# Patient Record
Sex: Female | Born: 1965 | Race: White | Hispanic: No | Smoking: Never smoker
Health system: Southern US, Community
[De-identification: ages and names within clinical notes are randomized; demographics above are authoritative.]

## PROBLEM LIST (undated history)

## (undated) DIAGNOSIS — C44309 Unspecified malignant neoplasm of skin of other parts of face: Secondary | ICD-10-CM

## (undated) HISTORY — DX: Unspecified malignant neoplasm of skin of other parts of face: C44.309

---

## 2013-01-18 ENCOUNTER — Ambulatory Visit (INDEPENDENT_AMBULATORY_CARE_PROVIDER_SITE_OTHER): Payer: BC Managed Care – PPO | Admitting: Family Medicine

## 2013-01-18 VITALS — BP 102/52 | HR 52 | Temp 97.9°F | Resp 16

## 2013-01-18 DIAGNOSIS — J029 Acute pharyngitis, unspecified: Secondary | ICD-10-CM

## 2013-01-18 NOTE — Patient Instructions (Addendum)
Try over the counter Claritin or zyrtec once per day for allergy as possible cause of post nasal drip causing sore throat. Can also try Zantac for possible laryngopharyngeal reflux. Recheck if not improving in next few weeks, sooner if any worsening. You should receive a call or letter about your lab results within the next week to 10 days.   Sore Throat A sore throat is pain, burning, irritation, or scratchiness of the throat. There is often pain or tenderness when swallowing or talking. A sore throat may be accompanied by other symptoms, such as coughing, sneezing, fever, and swollen neck glands. A sore throat is often the first sign of another sickness, such as a cold, flu, strep throat, or mononucleosis (commonly known as mono). Most sore throats go away without medical treatment. CAUSES  The most common causes of a sore throat include:  A viral infection, such as a cold, flu, or mono.  A bacterial infection, such as strep throat, tonsillitis, or whooping cough.  Seasonal allergies.  Dryness in the air.  Irritants, such as smoke or pollution.  Gastroesophageal reflux disease (GERD). HOME CARE INSTRUCTIONS   Only take over-the-counter medicines as directed by your caregiver.  Drink enough fluids to keep your urine clear or pale yellow.  Rest as needed.  Try using throat sprays, lozenges, or sucking on hard candy to ease any pain (if older than 4 years or as directed).  Sip warm liquids, such as broth, herbal tea, or warm water with honey to relieve pain temporarily. You may also eat or drink cold or frozen liquids such as frozen ice pops.  Gargle with salt water (mix 1 tsp salt with 8 oz of water).  Do not smoke and avoid secondhand smoke.  Put a cool-mist humidifier in your bedroom at night to moisten the air. You can also turn on a hot shower and sit in the bathroom with the door closed for 5 10 minutes. SEEK IMMEDIATE MEDICAL CARE IF:  You have difficulty breathing.  You  are unable to swallow fluids, soft foods, or your saliva.  You have increased swelling in the throat.  Your sore throat does not get better in 7 days.  You have nausea and vomiting.  You have a fever or persistent symptoms for more than 2 3 days.  You have a fever and your symptoms suddenly get worse. MAKE SURE YOU:   Understand these instructions.  Will watch your condition.  Will get help right away if you are not doing well or get worse. Document Released: 06/25/2004 Document Revised: 05/04/2012 Document Reviewed: 01/24/2012 [facility] Patient Information 2014 [place], [place].

## 2013-01-18 NOTE — Progress Notes (Signed)
Subjective:    Patient ID: Haley Wright, female    DOB: 05-14-1966, 47 y.o.   MRN: [id number]  HPI Haley Wright is a 47 y.o. female Sore throat - r side of throat - past 2 weeks.  No fever, no heartburn.  Hurts to swallow. No cough/congestion, no runny nose. Able to eat and drink ok.   Tx: none.    Review of Systems  Constitutional: Negative for fever, chills and unexpected weight change.  HENT: Positive for sore throat. Negative for congestion, rhinorrhea, drooling, mouth sores, trouble swallowing, voice change and postnasal drip.   Respiratory: Negative for cough and shortness of breath.   Skin: Negative for rash.  Hematological: Negative for adenopathy.       Objective:   Physical Exam  Vitals reviewed. Constitutional: She is oriented to person, place, and time. She appears well-developed and well-nourished. No distress.  HENT:  Head: Normocephalic and atraumatic.  Right Ear: Hearing, tympanic membrane, external ear and ear canal normal.  Left Ear: Hearing, tympanic membrane, external ear and ear canal normal.  Nose: Nose normal.  Mouth/Throat: Oropharynx is clear and moist. No oropharyngeal exudate.  Eyes: Conjunctivae and EOM are normal. Pupils are equal, round, and reactive to light.  Cardiovascular: Normal rate, regular rhythm, normal heart sounds and intact distal pulses.   No murmur heard. Pulmonary/Chest: Effort normal and breath sounds normal. No respiratory distress. She has no wheezes. She has no rhonchi.  Lymphadenopathy:    She has no cervical adenopathy.  Neurological: She is alert and oriented to person, place, and time.  Skin: Skin is warm and dry. No rash noted.  Psychiatric: She has a normal mood and affect. Her behavior is normal.    Results for orders placed in visit on 01/18/13  POCT RAPID STREP A (OFFICE)      Result Value Range   Rapid Strep A Screen Negative  Negative         Assessment & Plan:  Haley Wright is a 47 y.o. female Sore  throat - Plan: POCT rapid strep A, Culture, Group A Strep  Doubt infection given 2 weeks hx, reassuring exam, but could be viral in nature. Throat cx pending.  Possible PND, vs LPR. Trial of otc sx care as below, but rtc precautions if not resolving.   Patient Instructions  Try over the counter Claritin or zyrtec once per day for allergy as possible cause of post nasal drip causing sore throat. Can also try Zantac for possible laryngopharyngeal reflux. Recheck if not improving in next few weeks, sooner if any worsening. You should receive a call or letter about your lab results within the next week to 10 days.   Sore Throat A sore throat is pain, burning, irritation, or scratchiness of the throat. There is often pain or tenderness when swallowing or talking. A sore throat may be accompanied by other symptoms, such as coughing, sneezing, fever, and swollen neck glands. A sore throat is often the first sign of another sickness, such as a cold, flu, strep throat, or mononucleosis (commonly known as mono). Most sore throats go away without medical treatment. CAUSES  The most common causes of a sore throat include:  A viral infection, such as a cold, flu, or mono.  A bacterial infection, such as strep throat, tonsillitis, or whooping cough.  Seasonal allergies.  Dryness in the air.  Irritants, such as smoke or pollution.  Gastroesophageal reflux disease (GERD). HOME CARE INSTRUCTIONS   Only take over-the-counter medicines as directed  by your caregiver.  Drink enough fluids to keep your urine clear or pale yellow.  Rest as needed.  Try using throat sprays, lozenges, or sucking on hard candy to ease any pain (if older than 4 years or as directed).  Sip warm liquids, such as broth, herbal tea, or warm water with honey to relieve pain temporarily. You may also eat or drink cold or frozen liquids such as frozen ice pops.  Gargle with salt water (mix 1 tsp salt with 8 oz of water).  Do not  smoke and avoid secondhand smoke.  Put a cool-mist humidifier in your bedroom at night to moisten the air. You can also turn on a hot shower and sit in the bathroom with the door closed for 5 10 minutes. SEEK IMMEDIATE MEDICAL CARE IF:  You have difficulty breathing.  You are unable to swallow fluids, soft foods, or your saliva.  You have increased swelling in the throat.  Your sore throat does not get better in 7 days.  You have nausea and vomiting.  You have a fever or persistent symptoms for more than 2 3 days.  You have a fever and your symptoms suddenly get worse. MAKE SURE YOU:   Understand these instructions.  Will watch your condition.  Will get help right away if you are not doing well or get worse. Document Released: 06/25/2004 Document Revised: 05/04/2012 Document Reviewed: 01/24/2012 [facility] Patient Information 2014 [place], [place].

## 2013-01-20 LAB — CULTURE, GROUP A STREP: Organism ID, Bacteria: NORMAL

## 2013-03-06 ENCOUNTER — Ambulatory Visit (INDEPENDENT_AMBULATORY_CARE_PROVIDER_SITE_OTHER): Payer: BC Managed Care – PPO | Admitting: Neurology

## 2013-03-06 ENCOUNTER — Ambulatory Visit (INDEPENDENT_AMBULATORY_CARE_PROVIDER_SITE_OTHER): Payer: Self-pay

## 2013-03-06 DIAGNOSIS — Z0289 Encounter for other administrative examinations: Secondary | ICD-10-CM

## 2013-03-06 DIAGNOSIS — R209 Unspecified disturbances of skin sensation: Secondary | ICD-10-CM

## 2013-03-06 NOTE — Procedures (Signed)
HISTORY:  Haley Wright is a 47 year old patient with a 5 year history of paresthesias involving the lower extremities, going up two thirds the way below the knees. Within the last year, the patient has had similar sensations in the hands and forearms. The patient denies any neck or low back discomfort, problems with balance, or problems controlling the bowels or the bladder. The patient is being evaluated for a possible neuropathy or a radiculopathy.  NERVE CONDUCTION STUDIES:  Nerve conduction studies were performed on the right upper extremity. The distal motor latencies and motor amplitudes for the median and ulnar nerves were within normal limits. The F wave latencies and nerve conduction velocities for these nerves were also normal. The sensory latencies for the median and ulnar nerves were normal.  Nerve conduction studies were performed on right lower extremity. The distal motor latencies and motor amplitudes for the peroneal and posterior tibial nerves were within normal limits. The nerve conduction velocities for these nerves were also normal. The H reflex latency was normal. The sensory latency for the peroneal nerve was within normal limits.   EMG STUDIES:  EMG study was performed on the right upper extremity:  The first dorsal interosseous muscle reveals 2 to 4 K units with full recruitment. No fibrillations or positive waves were noted. The abductor pollicis brevis muscle reveals 2 to 4 K units with full recruitment. No fibrillations or positive waves were noted. The extensor indicis proprius muscle reveals 1 to 3 K units with full recruitment. No fibrillations or positive waves were noted. The pronator teres muscle reveals 2 to 3 K units with full recruitment. No fibrillations or positive waves were noted. The biceps muscle reveals 1 to 2 K units with full recruitment. No fibrillations or positive waves were noted. The triceps muscle reveals 2 to 4 K units with full recruitment.  No fibrillations or positive waves were noted. The anterior deltoid muscle reveals 2 to 3 K units with full recruitment. No fibrillations or positive waves were noted. The cervical paraspinal muscles were tested at 2 levels. No abnormalities of insertional activity were seen at either level tested. There was good relaxation.  EMG study was performed on the right lower extremity:  The tibialis anterior muscle reveals 2 to 4K motor units with full recruitment. No fibrillations or positive waves were seen. The peroneus tertius muscle reveals 2 to 4K motor units with full recruitment. No fibrillations or positive waves were seen. The medial gastrocnemius muscle reveals 1 to 3K motor units with full recruitment. No fibrillations or positive waves were seen. The vastus lateralis muscle reveals 2 to 4K motor units with full recruitment. No fibrillations or positive waves were seen. The iliopsoas muscle reveals 2 to 4K motor units with full recruitment. No fibrillations or positive waves were seen. The biceps femoris muscle (long head) reveals 2 to 4K motor units with full recruitment. No fibrillations or positive waves were seen. The lumbosacral paraspinal muscles were tested at 3 levels, and revealed no abnormalities of insertional activity at all 3 levels tested. There was good relaxation.   IMPRESSION:  Nerve conduction studies involving the right upper and right lower extremities were unremarkable. There is no evidence of a peripheral neuropathy. A small fiber neuropathy however, may be missed by a standard nerve conduction studies. Clinical correlation is required. EMG evaluation of the right upper and right lower extremities were unremarkable. No evidence of a right cervical or a right lumbosacral radiculopathy is seen.  Marlan Palau MD 03/06/2013 11:09  AM  [facility] [address] [place], [place] [address]  Phone [phone] Fax [phone]

## 2013-04-27 ENCOUNTER — Ambulatory Visit (INDEPENDENT_AMBULATORY_CARE_PROVIDER_SITE_OTHER): Payer: BC Managed Care – PPO | Admitting: Emergency Medicine

## 2013-04-27 VITALS — BP 90/48 | HR 57 | Temp 98.2°F | Resp 16 | Ht 69.75 in | Wt 150.2 lb

## 2013-04-27 DIAGNOSIS — J020 Streptococcal pharyngitis: Secondary | ICD-10-CM

## 2013-04-27 DIAGNOSIS — J029 Acute pharyngitis, unspecified: Secondary | ICD-10-CM

## 2013-04-27 LAB — POCT RAPID STREP A (OFFICE): Rapid Strep A Screen: POSITIVE — AB

## 2013-04-27 MED ORDER — PENICILLIN V POTASSIUM 500 MG PO TABS: 500.0000 mg | ORAL_TABLET | Freq: Four times a day (QID) | ORAL | Status: DC

## 2013-04-27 NOTE — Progress Notes (Signed)
[facility] [address], [place] [address] [phone]  Date:  04/27/2013   Name:  Haley Wright   DOB:  28-Jul-1965   MRN:  [id number]  PCP:  Gweneth Dimitri, MD    Chief Complaint: Adenopathy, Sore Throat and Otalgia   History of Present Illness:  Haley Wright is a 47 y.o. very pleasant female patient who presents with the following:  Sore throat, right lymphadenitis.  Tender with right intermittent otalgia.  No fever or chills, nausea or vomiting or stool change or rash.  No improvement with over the counter medications or other home remedies. Denies other complaint or health concern today.   There are no active problems to display for this patient.   History reviewed. No pertinent past medical history.  Past Surgical History  Procedure Laterality Date  . Cesarean section      History  Substance Use Topics  . Smoking status: Never Smoker   . Smokeless tobacco: Not on file  . Alcohol Use: No    History reviewed. No pertinent family history.  No Known Allergies  Medication list has been reviewed and updated.  No current outpatient prescriptions on file prior to visit.   No current facility-administered medications on file prior to visit.    Review of Systems:  As per HPI, otherwise negative.    Physical Examination: Filed Vitals:   04/27/13 0957  BP: 90/48  Pulse: 57  Temp: 98.2 F (36.8 C)  Resp: 16   Filed Vitals:   04/27/13 0957  Height: 5' 9.75" (1.772 m)  Weight: 150 lb 3.2 oz (68.13 kg)   Body mass index is 21.7 kg/(m^2). Ideal Body Weight: Weight in (lb) to have BMI = 25: 172.6  GEN: WDWN, NAD, Non-toxic, A & O x 3 HEENT: Atraumatic, Normocephalic. Neck supple. No masses, right anterior cervical LAD. Ears and Nose: No external deformity. CV: RRR, No M/G/R. No JVD. No thrill. No extra heart sounds. PULM: CTA B, no wheezes, crackles, rhonchi. No retractions. No resp. distress. No accessory muscle use. ABD: S,  NT, ND, +BS. No rebound. No HSM. EXTR: No c/c/e NEURO Normal gait.  PSYCH: Normally interactive. Conversant. Not depressed or anxious appearing.  Calm demeanor.    Assessment and Plan:  Strep Pen vk   Signed,  Phillips Odor, MD   Results for orders placed in visit on 04/27/13  POCT RAPID STREP A (OFFICE)      Result Value Range   Rapid Strep A Screen Positive (*) Negative

## 2013-04-27 NOTE — Patient Instructions (Signed)
Strep Throat  Strep throat is an infection of the throat caused by a bacteria named Streptococcus pyogenes. Your caregiver may call the infection streptococcal "tonsillitis" or "pharyngitis" depending on whether there are signs of inflammation in the tonsils or back of the throat. Strep throat is most common in children aged 48 48 years during the cold months of the year, but it can occur in people of any age during any season. This infection is spread from person to person (contagious) through coughing, sneezing, or other close contact.  SYMPTOMS   · Fever or chills.  · Painful, swollen, red tonsils or throat.  · Pain or difficulty when swallowing.  · White or yellow spots on the tonsils or throat.  · Swollen, tender lymph nodes or "glands" of the neck or under the jaw.  · Red rash all over the body (rare).  DIAGNOSIS   Many different infections can cause the same symptoms. A test must be done to confirm the diagnosis so the right treatment can be given. A "rapid strep test" can help your caregiver make the diagnosis in a few minutes. If this test is not available, a light swab of the infected area can be used for a throat culture test. If a throat culture test is done, results are usually available in a day or two.  TREATMENT   Strep throat is treated with antibiotic medicine.  HOME CARE INSTRUCTIONS   · Gargle with 1 tsp of salt in 1 cup of warm water, 3 4 times per day or as needed for comfort.  · Family members who also have a sore throat or fever should be tested for strep throat and treated with antibiotics if they have the strep infection.  · Make sure everyone in your household washes their hands well.  · Do not share food, drinking cups, or personal items that could cause the infection to spread to others.  · You may need to eat a soft food diet until your sore throat gets better.  · Drink enough water and fluids to keep your urine clear or pale yellow. This will help prevent dehydration.  · Get plenty of  rest.  · Stay home from school, daycare, or work until you have been on antibiotics for 24 hours.  · Only take over-the-counter or prescription medicines for pain, discomfort, or fever as directed by your caregiver.  · If antibiotics are prescribed, take them as directed. Finish them even if you start to feel better.  SEEK MEDICAL CARE IF:   · The glands in your neck continue to enlarge.  · You develop a rash, cough, or earache.  · You cough up green, yellow-brown, or bloody sputum.  · You have pain or discomfort not controlled by medicines.  · Your problems seem to be getting worse rather than better.  SEEK IMMEDIATE MEDICAL CARE IF:   · You develop any new symptoms such as vomiting, severe headache, stiff or painful neck, chest pain, shortness of breath, or trouble swallowing.  · You develop severe throat pain, drooling, or changes in your voice.  · You develop swelling of the neck, or the skin on the neck becomes red and tender.  · You have a fever.  · You develop signs of dehydration, such as fatigue, dry mouth, and decreased urination.  · You become increasingly sleepy, or you cannot wake up completely.  Document Released: 05/15/2000 Document Revised: 05/04/2012 Document Reviewed: 07/17/2010  [employer]® Patient Information ©2014 [employer].

## 2014-01-11 ENCOUNTER — Ambulatory Visit (INDEPENDENT_AMBULATORY_CARE_PROVIDER_SITE_OTHER): Payer: BC Managed Care – PPO | Admitting: Emergency Medicine

## 2014-01-11 VITALS — BP 114/62 | HR 49 | Temp 98.0°F | Resp 16 | Ht 70.0 in | Wt 154.6 lb

## 2014-01-11 DIAGNOSIS — Z0000 Encounter for general adult medical examination without abnormal findings: Secondary | ICD-10-CM

## 2014-01-11 NOTE — Progress Notes (Signed)
[facility] [address] [place] [address] [phone]  Date:  01/11/2014   Name:  Haley Wright   DOB:  1966-03-27   MRN:  [id number]  PCP:  Cari Caraway, MD    Chief Complaint: Paperwork   History of Present Illness:  Haley Wright is a 48 y.o. very pleasant female patient who presents with the following:  For record review   There are no active problems to display for this patient.   History reviewed. No pertinent past medical history.  Past Surgical History  Procedure Laterality Date  . Cesarean section      History  Substance Use Topics  . Smoking status: Never Smoker   . Smokeless tobacco: Not on file  . Alcohol Use: No    History reviewed. No pertinent family history.  No Known Allergies  Medication list has been reviewed and updated.  Current Outpatient Prescriptions on File Prior to Visit  Medication Sig Dispense Refill  . penicillin v potassium (VEETID) 500 MG tablet Take 1 tablet (500 mg total) by mouth 4 (four) times daily.  40 tablet  0   No current facility-administered medications on file prior to visit.    Review of Systems:  As per HPI, otherwise negative.    Physical Examination: Filed Vitals:   01/11/14 1048  BP: 114/62  Pulse: 49  Temp: 98 F (36.7 C)  Resp: 16   Filed Vitals:   01/11/14 1048  Height: 5\' 10"  (1.778 m)  Weight: 154 lb 9.6 oz (70.126 kg)   Body mass index is 22.18 kg/(m^2). Ideal Body Weight: Weight in (lb) to have BMI = 25: 173.9   GEN: WDWN, NAD, Non-toxic, Alert & Oriented x 3 HEENT: Atraumatic, Normocephalic.  Ears and Nose: No external deformity. EXTR: No clubbing/cyanosis/edema NEURO: Normal gait.  PSYCH: Normally interactive. Conversant. Not depressed or anxious appearing.  Calm demeanor.    Assessment and Plan: Medical record review   Signed,  Ellison Carwin, MD

## 2014-07-21 ENCOUNTER — Telehealth: Payer: Self-pay | Admitting: Physician Assistant

## 2014-07-21 NOTE — Telephone Encounter (Signed)
Entered in error

## 2015-06-12 ENCOUNTER — Ambulatory Visit (INDEPENDENT_AMBULATORY_CARE_PROVIDER_SITE_OTHER): Payer: BLUE CROSS/BLUE SHIELD | Admitting: Family Medicine

## 2015-06-12 VITALS — BP 110/76 | HR 86 | Temp 97.6°F | Resp 20 | Ht 69.0 in | Wt 156.6 lb

## 2015-06-12 DIAGNOSIS — J0100 Acute maxillary sinusitis, unspecified: Secondary | ICD-10-CM | POA: Diagnosis not present

## 2015-06-12 MED ORDER — AMOXICILLIN-POT CLAVULANATE 875-125 MG PO TABS: 1.0000 | ORAL_TABLET | Freq: Two times a day (BID) | ORAL | Status: DC

## 2015-06-12 NOTE — Progress Notes (Signed)
  Subjective:     Haley Wright is a 50 y.o. female who presents for evaluation of sinus pain. Symptoms include: clear rhinorrhea, congestion, nasal congestion, purulent rhinorrhea and sinus pressure. Onset of symptoms was 8 days ago. Symptoms have been gradually worsening since that time. Past history is significant for no history of pneumonia or bronchitis. Patient is a non-smoker.  The following portions of the patient's history were reviewed and updated as appropriate: allergies, current medications, past family history, past medical history, past social history, past surgical history and problem list.  Review of Systems Pertinent items are noted in HPI.   Objective:    BP 110/76 mmHg  Pulse 86  Temp(Src) 97.6 F (36.4 C) (Oral)  Resp 20  Ht 5\' 9"  (1.753 m)  Wt 156 lb 9.6 oz (71.033 kg)  BMI 23.12 kg/m2  SpO2 98%  LMP 05/11/2015 General appearance: alert, cooperative and appears stated age Nose: right turbinate pale, swollen, left turbinate pale, swollen, sinus tenderness bilateral Throat: lips, mucosa, and tongue normal; teeth and gums normal Chest: RRR, CTAB      Assessment:    Acute bacterial sinusitis.    Plan:    Nasal saline sprays. Neti pot recommended. Instructions given. Nasal steroids per medication orders. Augmentin per medication orders.

## 2015-06-15 ENCOUNTER — Ambulatory Visit (INDEPENDENT_AMBULATORY_CARE_PROVIDER_SITE_OTHER): Payer: BLUE CROSS/BLUE SHIELD | Admitting: Physician Assistant

## 2015-06-15 VITALS — BP 96/54 | HR 60 | Temp 97.6°F | Resp 18 | Ht 69.0 in | Wt 151.8 lb

## 2015-06-15 DIAGNOSIS — J0100 Acute maxillary sinusitis, unspecified: Secondary | ICD-10-CM

## 2015-06-15 MED ORDER — MOXIFLOXACIN HCL 400 MG PO TABS: 400.0000 mg | ORAL_TABLET | Freq: Every day | ORAL | Status: DC

## 2015-06-15 MED ORDER — PREDNISONE 20 MG PO TABS: ORAL_TABLET | ORAL | Status: DC

## 2015-06-15 MED ORDER — GUAIFENESIN ER 1200 MG PO TB12: 1.0000 | ORAL_TABLET | Freq: Two times a day (BID) | ORAL | Status: DC | PRN

## 2015-06-15 NOTE — Patient Instructions (Signed)
Get plenty of rest and drink at least 64 ounces of water daily. 

## 2015-06-15 NOTE — Progress Notes (Signed)
Patient ID: Haley Wright, female    DOB: [birth year], 50 y.o.   MRN: [id number]  PCP: Cari Caraway, MD  Subjective:   Chief Complaint  Patient presents with  . Medication Problem    Augmentin not working, pt wants to change prescription    HPI Presents for evaluation of sinusitis.  Was seen here on 06/12/2015 with these symptoms and prescribed Augmentin. Not any better. Now with eye irritation as well. Multiple sick contacts as an EMT. Subjective chills. None documented. Normal temperature when she was here 2 days ago. Netti pot use still produces gray-green drainage. Not coughing much, just from the drip in the throat.    Review of Systems As above. No GI/GU symptoms.    Patient Active Problem List   Diagnosis Date Noted  . 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 Authorizing Provider  amoxicillin-clavulanate (AUGMENTIN) 875-125 MG tablet Take 1 tablet by mouth 2 (two) times daily. 06/12/15  Yes Nolon Rod, DO  fluorouracil (EFUDEX) 5 % cream  04/04/15   Historical Provider, MD     No Known Allergies     Objective:  Physical Exam  Constitutional: She is oriented to person, place, and time. She appears well-developed and well-nourished. No distress.  BP 96/54 mmHg  Pulse 60  Temp(Src) 97.6 F (36.4 C) (Oral)  Resp 18  Ht 5\' 9"  (1.753 m)  Wt 151 lb 12.8 oz (68.856 kg)  BMI 22.41 kg/m2  SpO2 98%  LMP 05/11/2015   HENT:  Head: Normocephalic and atraumatic.  Right Ear: Hearing, tympanic membrane, external ear and ear canal normal.  Left Ear: Hearing, tympanic membrane, external ear and ear canal normal.  Nose: Mucosal edema and rhinorrhea present.  No foreign bodies. Right sinus exhibits maxillary sinus tenderness. Right sinus exhibits no frontal sinus tenderness. Left sinus exhibits maxillary sinus tenderness. Left sinus exhibits no frontal sinus tenderness.  Mouth/Throat: Uvula  is midline, oropharynx is clear and moist and mucous membranes are normal. No uvula swelling. No oropharyngeal exudate.  Eyes: Conjunctivae and EOM are normal. Pupils are equal, round, and reactive to light. Right eye exhibits no discharge. Left eye exhibits no discharge. No scleral icterus.  Neck: Trachea normal, normal range of motion, full passive range of motion without pain and phonation normal. Neck supple. No thyroid mass and no thyromegaly present.  Cardiovascular: Normal rate, regular rhythm and normal heart sounds.   Pulmonary/Chest: Effort normal and breath sounds normal.  Lymphadenopathy:       Head (right side): No submandibular, no tonsillar, no preauricular, no posterior auricular and no occipital adenopathy present.       Head (left side): No submandibular, no tonsillar, no preauricular and no occipital adenopathy present.    She has no cervical adenopathy.       Right: No supraclavicular adenopathy present.       Left: No supraclavicular adenopathy present.  Neurological: She is alert and oriented to person, place, and time. She has normal strength. No cranial nerve deficit or sensory deficit.  Skin: Skin is warm, dry and intact. No rash noted.  Psychiatric: She has a normal mood and affect. Her speech is normal and behavior is normal.           Assessment & Plan:   1. Acute maxillary sinusitis, recurrence not specified [J01.00] No better, and with new symptoms, on antibiotics x 48 hours. Switch to Avelox. Add prednisone. Supportive care. -  moxifloxacin (AVELOX) 400 MG tablet; Take 1 tablet (400 mg total) by mouth daily.  Dispense: 10 tablet; Refill: 0 - predniSONE (DELTASONE) 20 MG tablet; Take 3 PO QAM x3days, 2 PO QAM x3days, 1 PO QAM x3days  Dispense: 18 tablet; Refill: 0 - Guaifenesin (MUCINEX MAXIMUM STRENGTH) 1200 MG TB12; Take 1 tablet (1,200 mg total) by mouth every 12 (twelve) hours as needed.  Dispense: 14 tablet; Refill: 1   Fara Chute,  PA-C Physician Assistant-Certified [facility]

## 2015-06-17 NOTE — Progress Notes (Signed)
  Medical screening examination/treatment/procedure(s) were performed by non-physician practitioner and as supervising physician I was immediately available for consultation/collaboration.     

## 2015-09-04 DIAGNOSIS — M79645 Pain in left finger(s): Secondary | ICD-10-CM | POA: Diagnosis not present

## 2015-09-18 DIAGNOSIS — M79645 Pain in left finger(s): Secondary | ICD-10-CM | POA: Diagnosis not present

## 2016-01-14 DIAGNOSIS — N8111 Cystocele, midline: Secondary | ICD-10-CM | POA: Diagnosis not present

## 2016-01-22 DIAGNOSIS — M25551 Pain in right hip: Secondary | ICD-10-CM | POA: Diagnosis not present

## 2016-02-28 DIAGNOSIS — Z85828 Personal history of other malignant neoplasm of skin: Secondary | ICD-10-CM | POA: Diagnosis not present

## 2016-02-28 DIAGNOSIS — L814 Other melanin hyperpigmentation: Secondary | ICD-10-CM | POA: Diagnosis not present

## 2016-02-28 DIAGNOSIS — D2262 Melanocytic nevi of left upper limb, including shoulder: Secondary | ICD-10-CM | POA: Diagnosis not present

## 2016-02-28 DIAGNOSIS — D225 Melanocytic nevi of trunk: Secondary | ICD-10-CM | POA: Diagnosis not present

## 2016-03-04 DIAGNOSIS — L290 Pruritus ani: Secondary | ICD-10-CM | POA: Diagnosis not present

## 2016-03-04 DIAGNOSIS — K642 Third degree hemorrhoids: Secondary | ICD-10-CM | POA: Diagnosis not present

## 2016-03-04 DIAGNOSIS — K625 Hemorrhage of anus and rectum: Secondary | ICD-10-CM | POA: Diagnosis not present

## 2016-03-25 DIAGNOSIS — K641 Second degree hemorrhoids: Secondary | ICD-10-CM | POA: Diagnosis not present

## 2016-04-08 DIAGNOSIS — K641 Second degree hemorrhoids: Secondary | ICD-10-CM | POA: Diagnosis not present

## 2016-05-11 DIAGNOSIS — Z1211 Encounter for screening for malignant neoplasm of colon: Secondary | ICD-10-CM | POA: Diagnosis not present

## 2016-07-09 DIAGNOSIS — L570 Actinic keratosis: Secondary | ICD-10-CM | POA: Diagnosis not present

## 2016-07-09 DIAGNOSIS — L821 Other seborrheic keratosis: Secondary | ICD-10-CM | POA: Diagnosis not present

## 2016-07-27 DIAGNOSIS — Z0000 Encounter for general adult medical examination without abnormal findings: Secondary | ICD-10-CM | POA: Diagnosis not present

## 2016-07-27 DIAGNOSIS — N926 Irregular menstruation, unspecified: Secondary | ICD-10-CM | POA: Diagnosis not present

## 2016-09-22 DIAGNOSIS — Z01419 Encounter for gynecological examination (general) (routine) without abnormal findings: Secondary | ICD-10-CM | POA: Diagnosis not present

## 2016-09-22 DIAGNOSIS — Z6821 Body mass index (BMI) 21.0-21.9, adult: Secondary | ICD-10-CM | POA: Diagnosis not present

## 2017-01-01 DIAGNOSIS — M25551 Pain in right hip: Secondary | ICD-10-CM | POA: Diagnosis not present

## 2017-01-07 DIAGNOSIS — M25551 Pain in right hip: Secondary | ICD-10-CM | POA: Diagnosis not present

## 2017-01-18 DIAGNOSIS — H1089 Other conjunctivitis: Secondary | ICD-10-CM | POA: Diagnosis not present

## 2017-01-26 DIAGNOSIS — B309 Viral conjunctivitis, unspecified: Secondary | ICD-10-CM | POA: Diagnosis not present

## 2017-02-05 DIAGNOSIS — M25551 Pain in right hip: Secondary | ICD-10-CM | POA: Diagnosis not present

## 2017-02-05 DIAGNOSIS — S73191D Other sprain of right hip, subsequent encounter: Secondary | ICD-10-CM | POA: Diagnosis not present

## 2017-02-12 DIAGNOSIS — L237 Allergic contact dermatitis due to plants, except food: Secondary | ICD-10-CM | POA: Diagnosis not present

## 2017-02-12 DIAGNOSIS — B309 Viral conjunctivitis, unspecified: Secondary | ICD-10-CM | POA: Diagnosis not present

## 2017-02-12 DIAGNOSIS — H40033 Anatomical narrow angle, bilateral: Secondary | ICD-10-CM | POA: Diagnosis not present

## 2017-02-19 DIAGNOSIS — B309 Viral conjunctivitis, unspecified: Secondary | ICD-10-CM | POA: Diagnosis not present

## 2017-02-19 DIAGNOSIS — H1089 Other conjunctivitis: Secondary | ICD-10-CM | POA: Diagnosis not present

## 2017-02-19 DIAGNOSIS — H40033 Anatomical narrow angle, bilateral: Secondary | ICD-10-CM | POA: Diagnosis not present

## 2017-03-25 DIAGNOSIS — H01009 Unspecified blepharitis unspecified eye, unspecified eyelid: Secondary | ICD-10-CM | POA: Diagnosis not present

## 2017-03-25 DIAGNOSIS — B309 Viral conjunctivitis, unspecified: Secondary | ICD-10-CM | POA: Diagnosis not present

## 2017-04-08 DIAGNOSIS — H10013 Acute follicular conjunctivitis, bilateral: Secondary | ICD-10-CM | POA: Diagnosis not present

## 2017-04-08 DIAGNOSIS — H01009 Unspecified blepharitis unspecified eye, unspecified eyelid: Secondary | ICD-10-CM | POA: Diagnosis not present

## 2017-04-08 DIAGNOSIS — H40043 Steroid responder, bilateral: Secondary | ICD-10-CM | POA: Diagnosis not present

## 2017-04-08 DIAGNOSIS — H40053 Ocular hypertension, bilateral: Secondary | ICD-10-CM | POA: Diagnosis not present

## 2017-04-29 DIAGNOSIS — H10013 Acute follicular conjunctivitis, bilateral: Secondary | ICD-10-CM | POA: Diagnosis not present

## 2017-04-29 DIAGNOSIS — D2310 Other benign neoplasm of skin of unspecified eyelid, including canthus: Secondary | ICD-10-CM | POA: Diagnosis not present

## 2017-04-29 DIAGNOSIS — H40043 Steroid responder, bilateral: Secondary | ICD-10-CM | POA: Diagnosis not present

## 2017-04-29 DIAGNOSIS — H01009 Unspecified blepharitis unspecified eye, unspecified eyelid: Secondary | ICD-10-CM | POA: Diagnosis not present

## 2017-06-28 DIAGNOSIS — L570 Actinic keratosis: Secondary | ICD-10-CM | POA: Diagnosis not present

## 2017-06-28 DIAGNOSIS — C4441 Basal cell carcinoma of skin of scalp and neck: Secondary | ICD-10-CM | POA: Diagnosis not present

## 2017-06-28 DIAGNOSIS — L814 Other melanin hyperpigmentation: Secondary | ICD-10-CM | POA: Diagnosis not present

## 2017-06-28 DIAGNOSIS — D485 Neoplasm of uncertain behavior of skin: Secondary | ICD-10-CM | POA: Diagnosis not present

## 2017-06-28 DIAGNOSIS — D225 Melanocytic nevi of trunk: Secondary | ICD-10-CM | POA: Diagnosis not present

## 2017-08-23 DIAGNOSIS — C4441 Basal cell carcinoma of skin of scalp and neck: Secondary | ICD-10-CM | POA: Diagnosis not present

## 2017-09-23 DIAGNOSIS — Z803 Family history of malignant neoplasm of breast: Secondary | ICD-10-CM | POA: Diagnosis not present

## 2017-09-23 DIAGNOSIS — Z6822 Body mass index (BMI) 22.0-22.9, adult: Secondary | ICD-10-CM | POA: Diagnosis not present

## 2017-09-23 DIAGNOSIS — Z806 Family history of leukemia: Secondary | ICD-10-CM | POA: Diagnosis not present

## 2017-09-23 DIAGNOSIS — Z01419 Encounter for gynecological examination (general) (routine) without abnormal findings: Secondary | ICD-10-CM | POA: Diagnosis not present

## 2017-09-23 DIAGNOSIS — Z808 Family history of malignant neoplasm of other organs or systems: Secondary | ICD-10-CM | POA: Diagnosis not present

## 2017-10-19 DIAGNOSIS — H04123 Dry eye syndrome of bilateral lacrimal glands: Secondary | ICD-10-CM | POA: Diagnosis not present

## 2017-10-19 DIAGNOSIS — H40053 Ocular hypertension, bilateral: Secondary | ICD-10-CM | POA: Diagnosis not present

## 2017-10-19 DIAGNOSIS — H40013 Open angle with borderline findings, low risk, bilateral: Secondary | ICD-10-CM | POA: Diagnosis not present

## 2017-10-19 DIAGNOSIS — H1013 Acute atopic conjunctivitis, bilateral: Secondary | ICD-10-CM | POA: Diagnosis not present

## 2017-11-05 DIAGNOSIS — Z79890 Hormone replacement therapy (postmenopausal): Secondary | ICD-10-CM | POA: Diagnosis not present

## 2017-11-05 DIAGNOSIS — Z13820 Encounter for screening for osteoporosis: Secondary | ICD-10-CM | POA: Diagnosis not present

## 2017-11-05 DIAGNOSIS — Z809 Family history of malignant neoplasm, unspecified: Secondary | ICD-10-CM | POA: Diagnosis not present

## 2017-11-30 DIAGNOSIS — H01009 Unspecified blepharitis unspecified eye, unspecified eyelid: Secondary | ICD-10-CM | POA: Diagnosis not present

## 2017-11-30 DIAGNOSIS — H40053 Ocular hypertension, bilateral: Secondary | ICD-10-CM | POA: Diagnosis not present

## 2017-11-30 DIAGNOSIS — H04123 Dry eye syndrome of bilateral lacrimal glands: Secondary | ICD-10-CM | POA: Diagnosis not present

## 2017-11-30 DIAGNOSIS — H40013 Open angle with borderline findings, low risk, bilateral: Secondary | ICD-10-CM | POA: Diagnosis not present

## 2017-11-30 DIAGNOSIS — H1013 Acute atopic conjunctivitis, bilateral: Secondary | ICD-10-CM | POA: Diagnosis not present

## 2017-12-28 DIAGNOSIS — D1800 Hemangioma unspecified site: Secondary | ICD-10-CM | POA: Diagnosis not present

## 2017-12-28 DIAGNOSIS — L820 Inflamed seborrheic keratosis: Secondary | ICD-10-CM | POA: Diagnosis not present

## 2018-01-24 DIAGNOSIS — K648 Other hemorrhoids: Secondary | ICD-10-CM | POA: Diagnosis not present

## 2018-01-24 DIAGNOSIS — K623 Rectal prolapse: Secondary | ICD-10-CM | POA: Diagnosis not present

## 2018-01-26 DIAGNOSIS — H02834 Dermatochalasis of left upper eyelid: Secondary | ICD-10-CM | POA: Diagnosis not present

## 2018-01-26 DIAGNOSIS — H02831 Dermatochalasis of right upper eyelid: Secondary | ICD-10-CM | POA: Diagnosis not present

## 2018-01-26 DIAGNOSIS — H04123 Dry eye syndrome of bilateral lacrimal glands: Secondary | ICD-10-CM | POA: Diagnosis not present

## 2018-01-26 DIAGNOSIS — L723 Sebaceous cyst: Secondary | ICD-10-CM | POA: Diagnosis not present

## 2018-03-21 DIAGNOSIS — K623 Rectal prolapse: Secondary | ICD-10-CM | POA: Diagnosis not present

## 2018-04-14 DIAGNOSIS — L570 Actinic keratosis: Secondary | ICD-10-CM | POA: Diagnosis not present

## 2018-04-14 DIAGNOSIS — L814 Other melanin hyperpigmentation: Secondary | ICD-10-CM | POA: Diagnosis not present

## 2018-04-14 DIAGNOSIS — D225 Melanocytic nevi of trunk: Secondary | ICD-10-CM | POA: Diagnosis not present

## 2018-04-14 DIAGNOSIS — L820 Inflamed seborrheic keratosis: Secondary | ICD-10-CM | POA: Diagnosis not present

## 2018-06-07 DIAGNOSIS — K623 Rectal prolapse: Secondary | ICD-10-CM | POA: Diagnosis not present

## 2018-06-07 DIAGNOSIS — K648 Other hemorrhoids: Secondary | ICD-10-CM | POA: Diagnosis not present

## 2018-06-07 DIAGNOSIS — J0100 Acute maxillary sinusitis, unspecified: Secondary | ICD-10-CM | POA: Diagnosis not present

## 2018-07-05 DIAGNOSIS — N816 Rectocele: Secondary | ICD-10-CM | POA: Diagnosis not present

## 2018-07-21 DIAGNOSIS — N816 Rectocele: Secondary | ICD-10-CM | POA: Diagnosis not present

## 2018-10-14 DIAGNOSIS — K642 Third degree hemorrhoids: Secondary | ICD-10-CM | POA: Diagnosis not present

## 2018-10-26 DIAGNOSIS — Z1159 Encounter for screening for other viral diseases: Secondary | ICD-10-CM | POA: Diagnosis not present

## 2018-10-28 DIAGNOSIS — K642 Third degree hemorrhoids: Secondary | ICD-10-CM | POA: Diagnosis not present

## 2018-10-28 DIAGNOSIS — K648 Other hemorrhoids: Secondary | ICD-10-CM | POA: Diagnosis not present

## 2018-11-09 DIAGNOSIS — Z6820 Body mass index (BMI) 20.0-20.9, adult: Secondary | ICD-10-CM | POA: Diagnosis not present

## 2018-11-09 DIAGNOSIS — Z01419 Encounter for gynecological examination (general) (routine) without abnormal findings: Secondary | ICD-10-CM | POA: Diagnosis not present

## 2019-02-14 DIAGNOSIS — K642 Third degree hemorrhoids: Secondary | ICD-10-CM | POA: Diagnosis not present

## 2019-02-21 DIAGNOSIS — K648 Other hemorrhoids: Secondary | ICD-10-CM | POA: Diagnosis not present

## 2019-02-21 DIAGNOSIS — K642 Third degree hemorrhoids: Secondary | ICD-10-CM | POA: Diagnosis not present

## 2019-04-21 DIAGNOSIS — D225 Melanocytic nevi of trunk: Secondary | ICD-10-CM | POA: Diagnosis not present

## 2019-04-21 DIAGNOSIS — L821 Other seborrheic keratosis: Secondary | ICD-10-CM | POA: Diagnosis not present

## 2019-04-21 DIAGNOSIS — D485 Neoplasm of uncertain behavior of skin: Secondary | ICD-10-CM | POA: Diagnosis not present

## 2019-04-21 DIAGNOSIS — D2271 Melanocytic nevi of right lower limb, including hip: Secondary | ICD-10-CM | POA: Diagnosis not present

## 2019-04-21 DIAGNOSIS — Z85828 Personal history of other malignant neoplasm of skin: Secondary | ICD-10-CM | POA: Diagnosis not present

## 2019-04-21 DIAGNOSIS — Z86018 Personal history of other benign neoplasm: Secondary | ICD-10-CM | POA: Diagnosis not present

## 2019-04-21 DIAGNOSIS — D2262 Melanocytic nevi of left upper limb, including shoulder: Secondary | ICD-10-CM | POA: Diagnosis not present

## 2021-11-06 ENCOUNTER — Other Ambulatory Visit: Payer: Self-pay | Admitting: Sports Medicine

## 2021-11-06 DIAGNOSIS — M545 Low back pain, unspecified: Secondary | ICD-10-CM

## 2021-11-16 ENCOUNTER — Ambulatory Visit
Admission: RE | Admit: 2021-11-16 | Discharge: 2021-11-16 | Disposition: A | Discharge status: Home / Self Care | Payer: Self-pay | Source: Ambulatory Visit | Attending: Sports Medicine | Admitting: Sports Medicine

## 2021-11-16 DIAGNOSIS — M545 Low back pain, unspecified: Secondary | ICD-10-CM

## 2021-11-16 IMAGING — MR MR PELVIS W/O CM
4 of 5 series · 27 of 48 positions shown · non-contrast
Comparison: None.

CLINICAL DATA: Low back pain radiating into the pelvis for 1 year.

EXAM:
MRI PELVIS WITHOUT CONTRAST
TECHNIQUE: Multiplanar, multisequence MR imaging of the pelvis was performed.
No intravenous contrast was administered.

[Series 3: STIR · coronal · right · 3.0mm · 1.06mm/px · 8 of 45 slices shown]
[im 1/45]
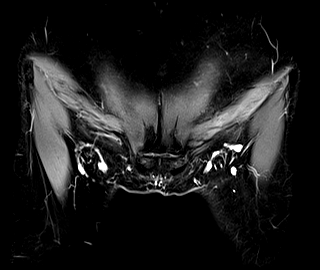
[im 5/45]
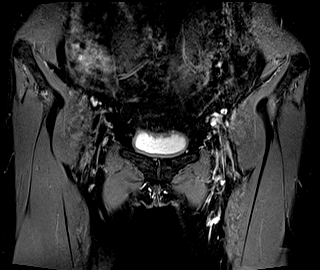
[im 15/45]
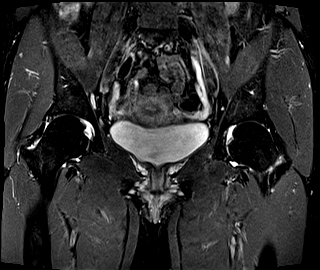
[im 20/45]
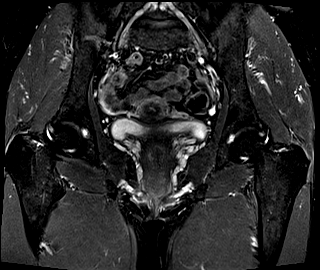
[im 25/45]
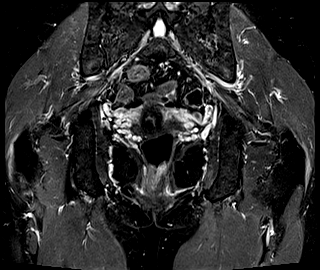
[im 30/45]
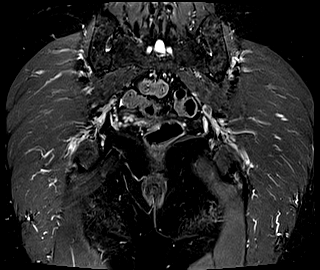
[im 40/45]
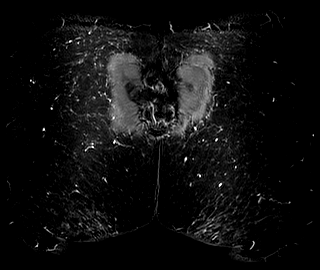
[im 45/45]
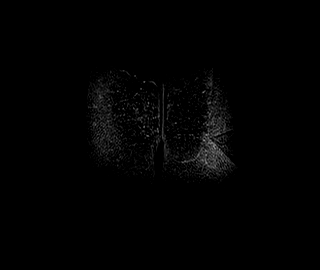

[Series 4: T1 · coronal · right · 3.0mm · 0.78mm/px · 11 of 45 slices shown (1 of 2)]
[im 1/45]
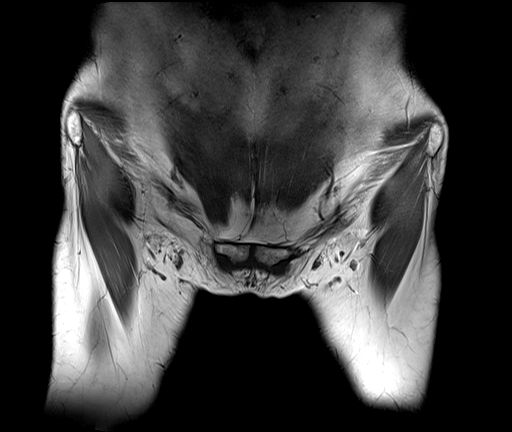
[im 5/45]
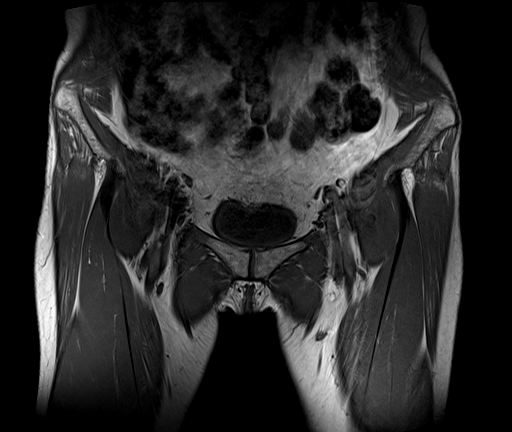
[im 9/45]
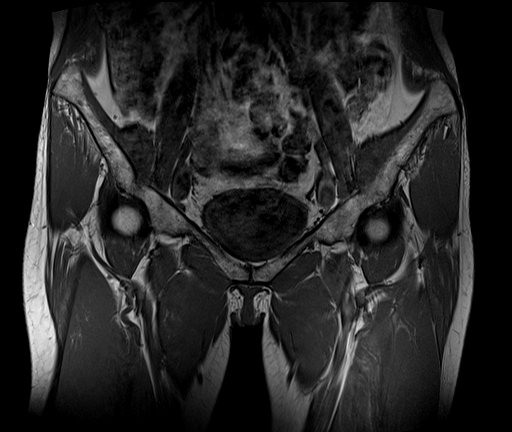
[im 14/45]
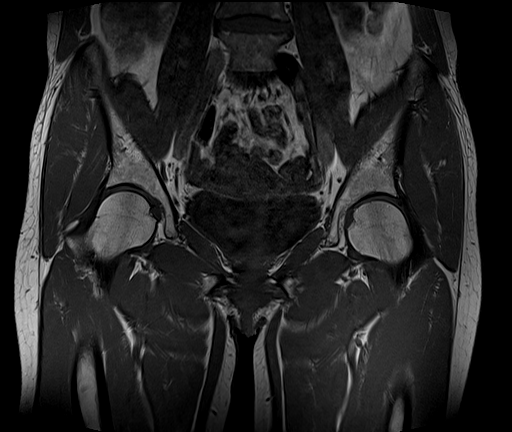
[im 18/45]
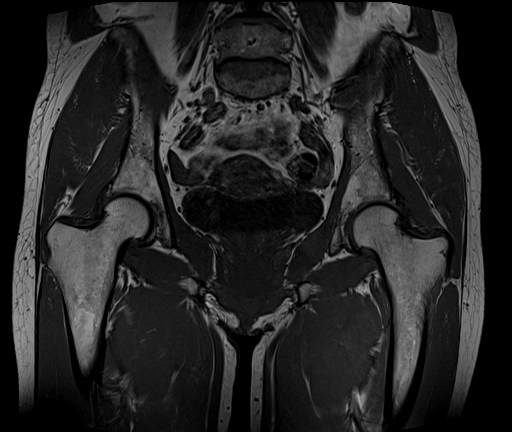
[im 23/45]
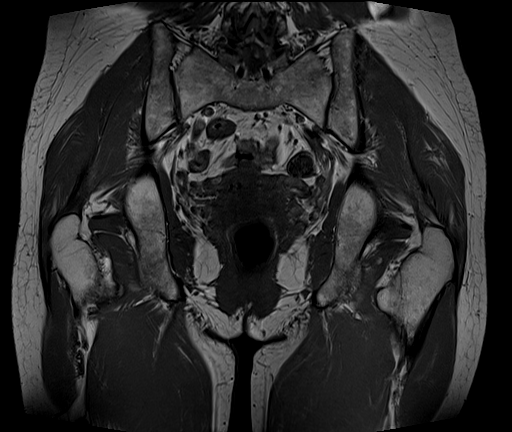
[im 27/45]
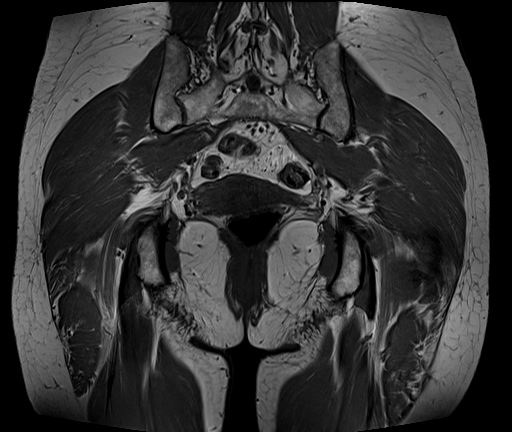
[im 31/45]
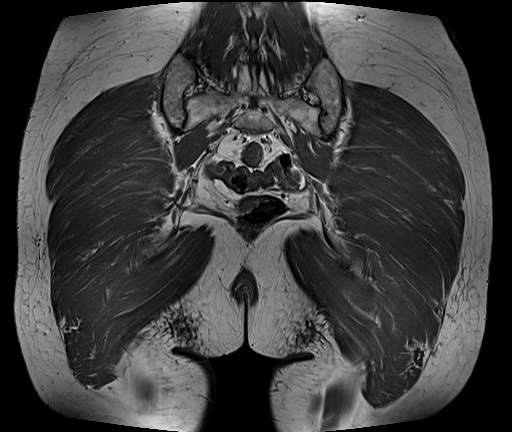
[im 36/45]
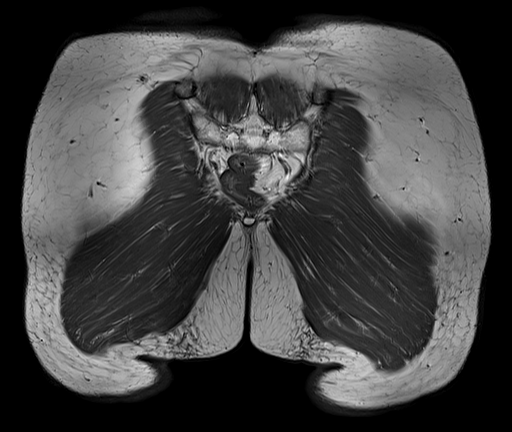
[im 40/45]
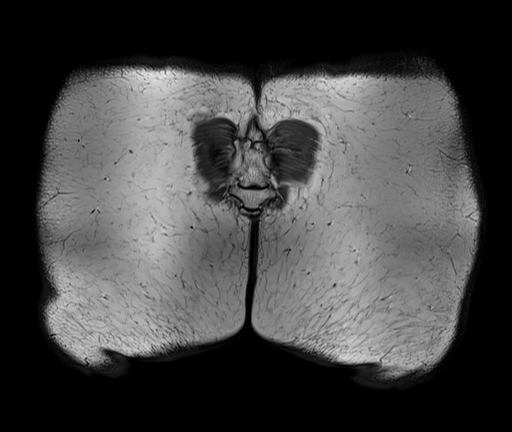
[im 45/45]
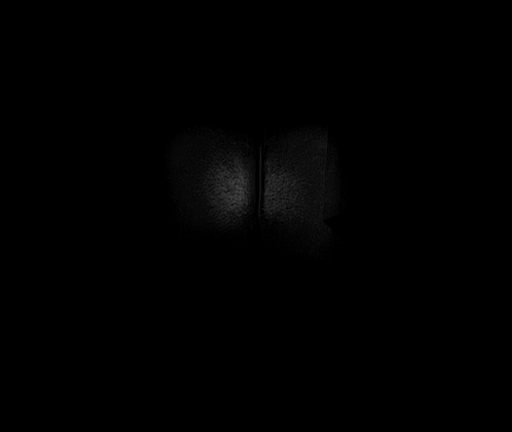

[Series 5: T1 · axial · right · 5.0mm · 0.78mm/px · z∈[-51,+137]mm · 5 of 33 slices shown (2 of 2)]
[im 1/33]
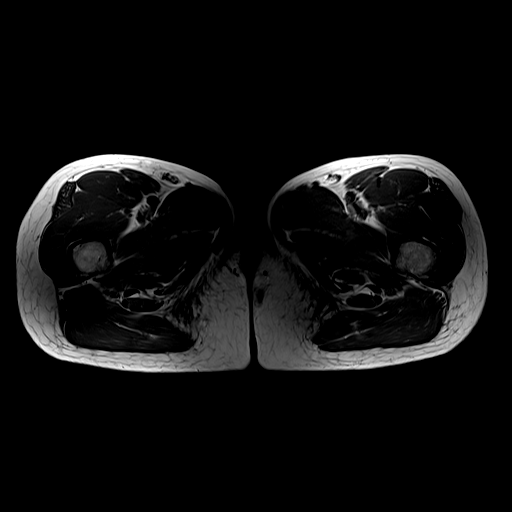
[im 5/33]
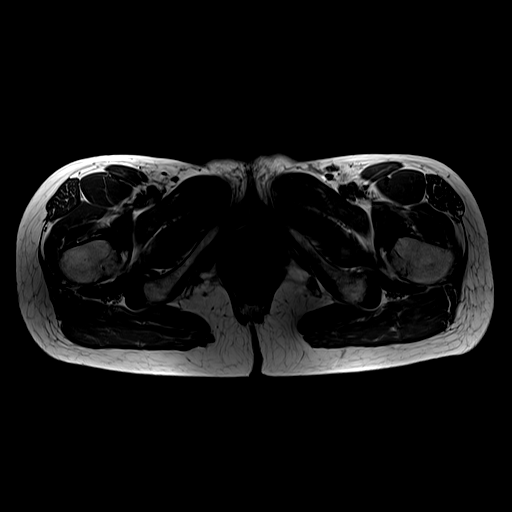
[im 10/33]
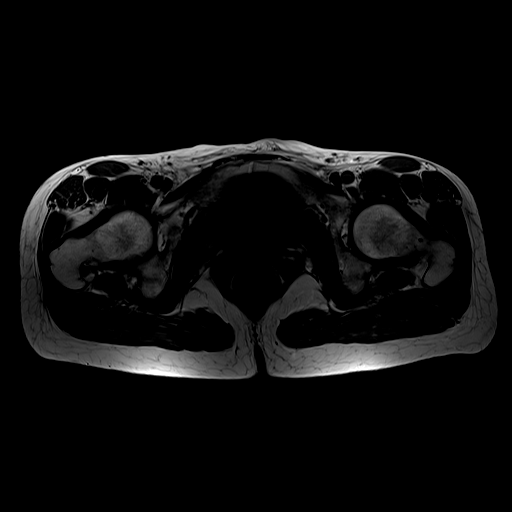
[im 19/33]
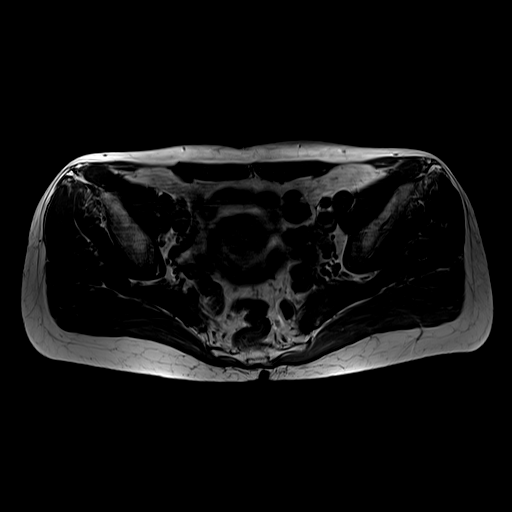
[im 28/33]
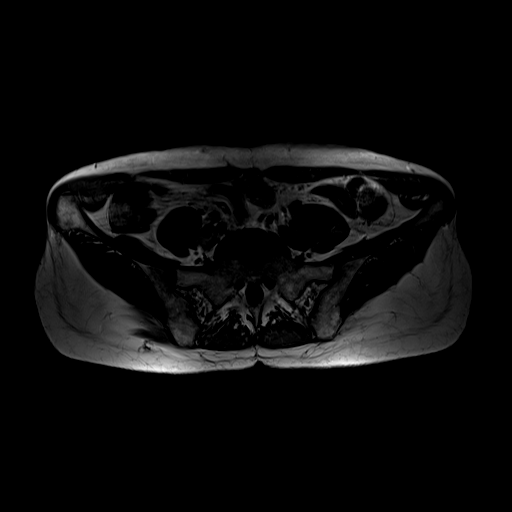

[Series 6: T2 fat-sat · axial · right · 5.0mm · 0.78mm/px · z∈[-13,+127]mm · 3 of 30 slices shown]
[im 5/30]
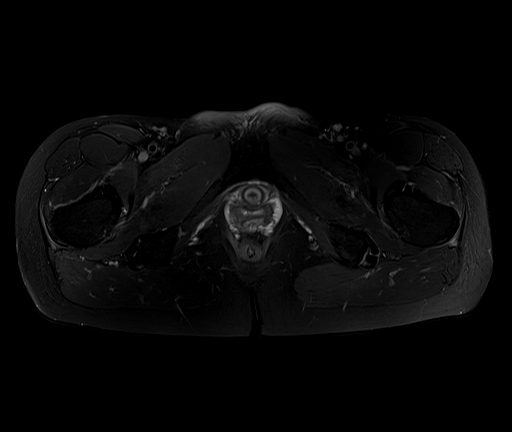
[im 15/30]
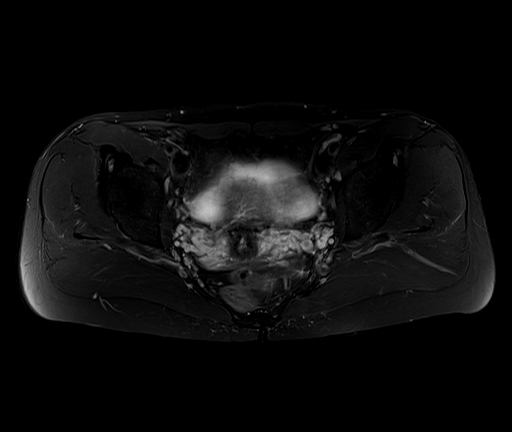
[im 25/30]
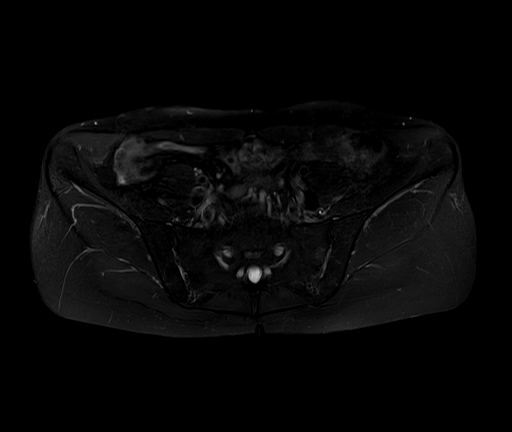

[27 of 48 positions shown; findings below may reference images not displayed]

FINDINGS: Bones/Joint/Cartilage

No fracture, stress change or worrisome lesion is identified. There
is no avascular necrosis of the femoral heads. No subchondral cyst
formation or edema about the hips. No joint effusion. Intrasubstance
increased T2 signal is seen within the substance of both the right
and left acetabular labrum and consistent with labral tears, more
extensive on the right.

Ligaments

Appear normal.

Muscles and Tendons

Intact and normal appearance.

Soft tissues

Negative.
IMPRESSION: No acute abnormality or finding to explain diffuse pelvic pain.

Intrasubstance increased T2 signal in both the right and left
acetabular labrum consistent with labral tearing, worse on the
right.

## 2021-11-16 IMAGING — MR MR LUMBAR SPINE W/O CM
4 of 5 series · 18 of 48 positions shown · non-contrast
Comparison: None Available.

CLINICAL DATA: Low back pain radiating into the pelvis for 1 year.

EXAM:
MRI LUMBAR SPINE WITHOUT CONTRAST
TECHNIQUE: Multiplanar, multisequence MR imaging of the lumbar spine was
performed. No intravenous contrast was administered.

[Series 5: T2 · sagittal · 4.0mm · 0.73mm/px · 6 of 17 slices shown (1 of 2)]
[im 1/17]
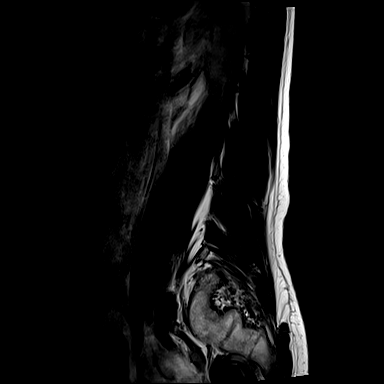
[im 4/17]
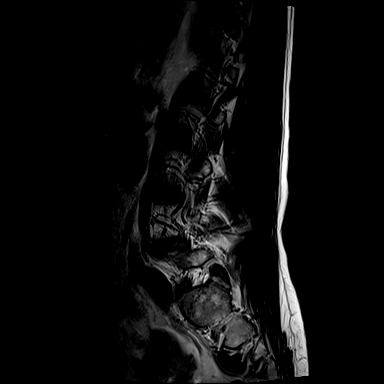
[im 7/17]
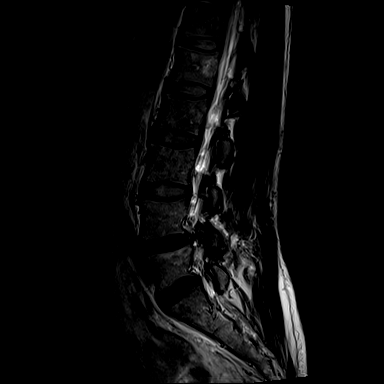
[im 10/17]
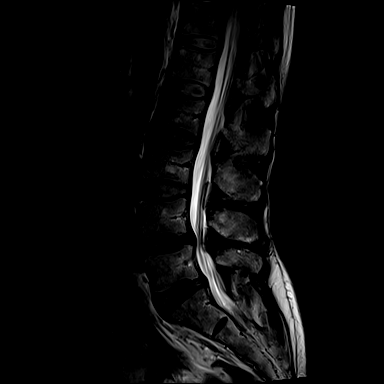
[im 13/17]
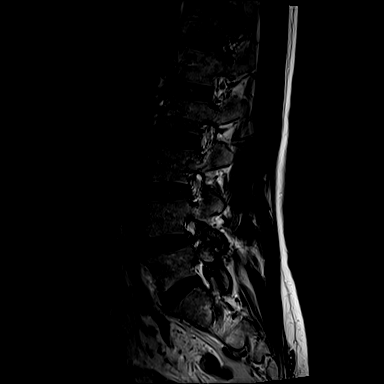
[im 17/17]
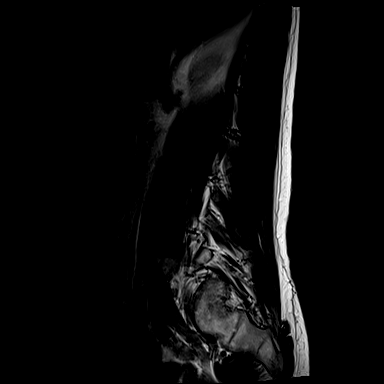

[Series 6: T1 · sagittal · 4.0mm · 0.73mm/px · 3 of 17 slices shown (1 of 2)]
[im 4/17]
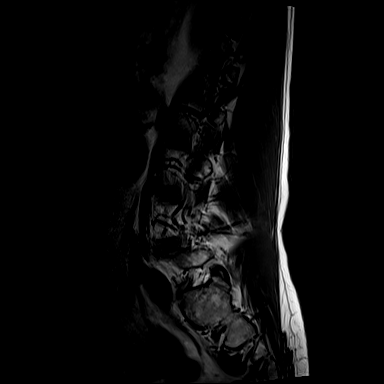
[im 10/17]
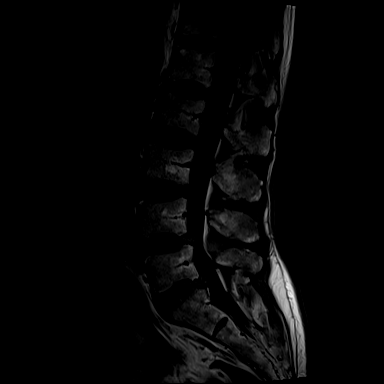
[im 17/17]
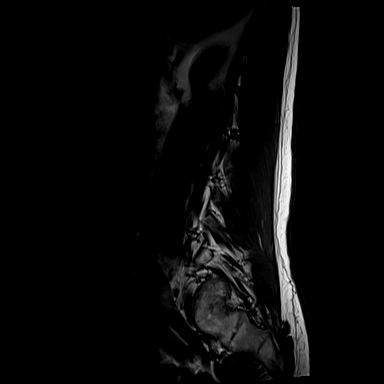

[Series 10: T1 · axial · 4.0mm · 0.28mm/px · z∈[-30,+141]mm · 3 of 42 slices shown (2 of 2)]
[im 6/42]
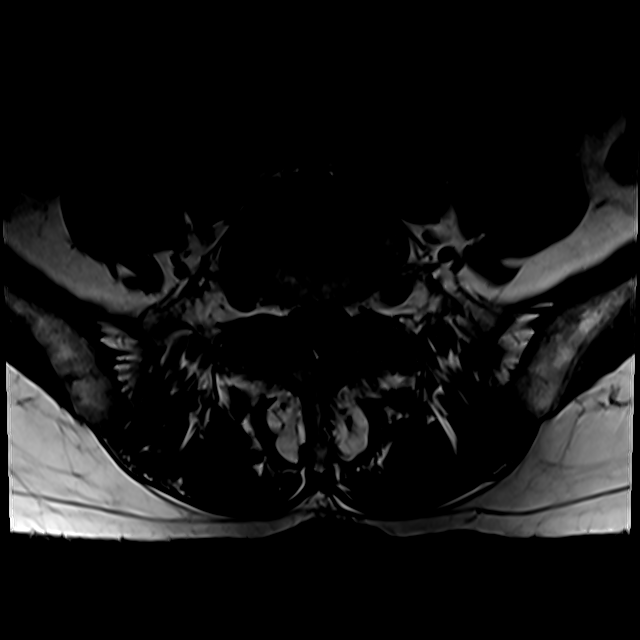
[im 21/42]
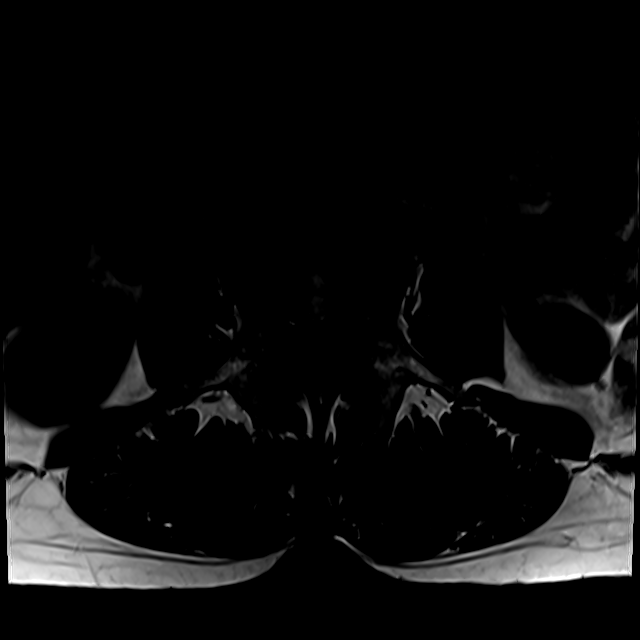
[im 36/42]
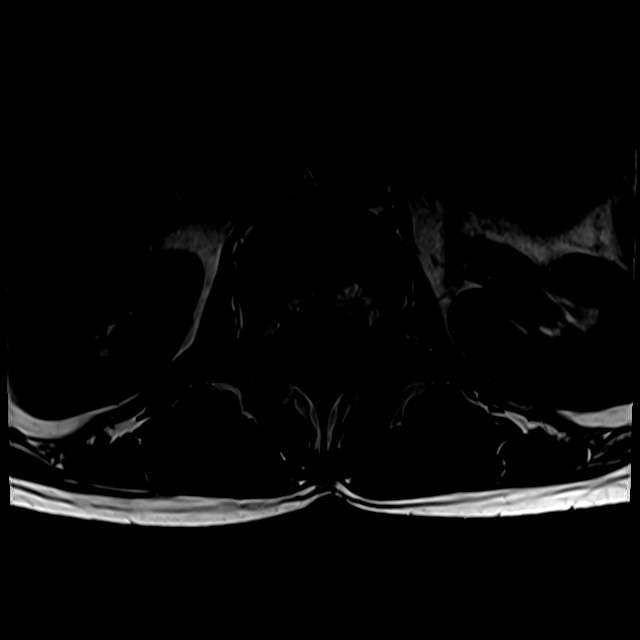

[Series 13: T2 · axial · 4.0mm · 0.28mm/px · z∈[-55,+141]mm · 6 of 42 slices shown (2 of 2)]
[im 1/42]
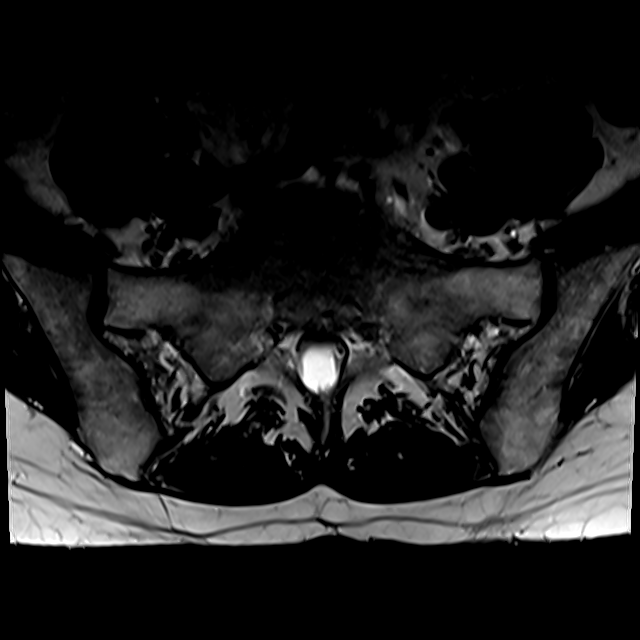
[im 6/42]
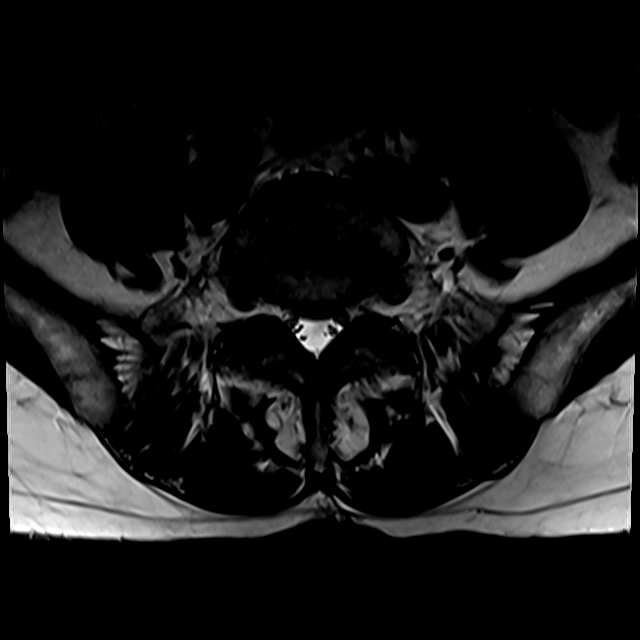
[im 12/42]
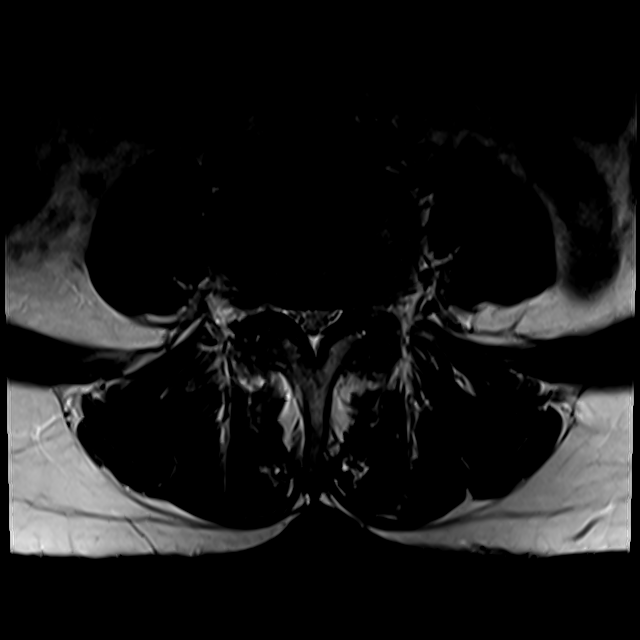
[im 18/42]
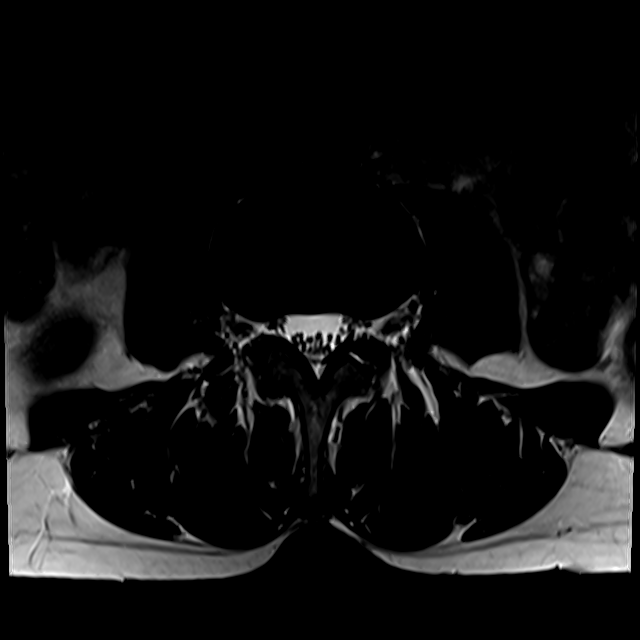
[im 21/42]
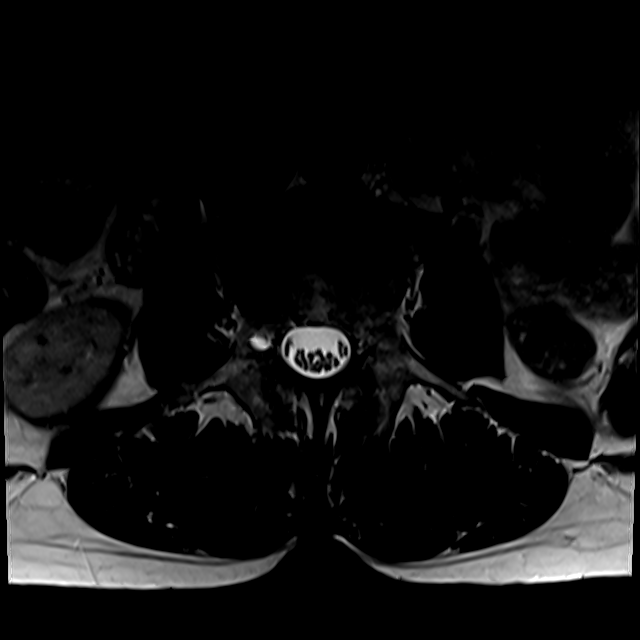
[im 36/42]
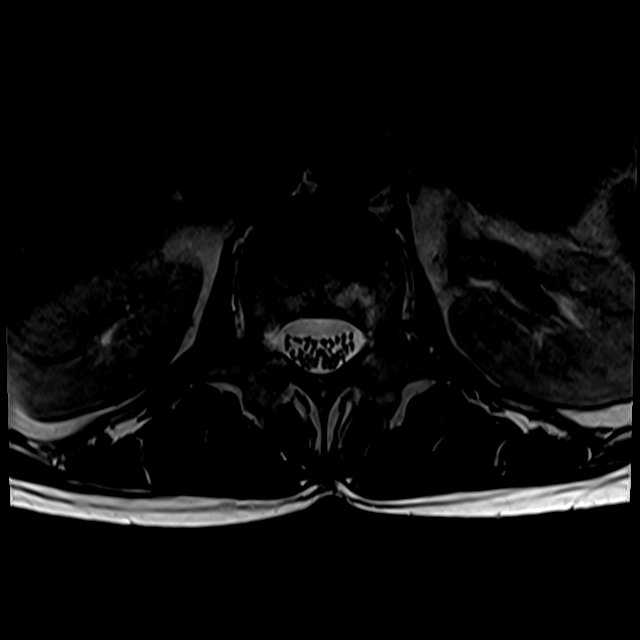

[18 of 48 positions shown; findings below may reference images not displayed]

FINDINGS: Segmentation:  Standard.

Alignment:  Trace anterolisthesis of L4 on L5.

Vertebrae: Prominent bilateral facet edema at L4-5. No fracture or
suspicious marrow lesion. Hemangioma in the L1 vertebral body.

Conus medullaris and cauda equina: Conus extends to the L1 level.
Conus and cauda equina appear normal.

Paraspinal and other soft tissues: Unremarkable.

Disc levels:

T12-L1 through L3-4: Negative.

L4-5: Disc desiccation and mild disc space narrowing.
Anterolisthesis with bulging uncovered disc, congenitally short
pedicles, and moderate facet and ligamentum flavum hypertrophy
result in moderate spinal stenosis and mild left greater than right
neural foraminal stenosis. Small bilateral facet joint effusions.

L5-S1: Disc desiccation. Mild disc bulging with small right
subarticular annular fissure and mild facet hypertrophy. No
significant stenosis.
IMPRESSION: 1. Facet arthritis at L4-5 with prominent edema, trace
anterolisthesis and moderate spinal stenosis.
2. Mild disc bulging and facet hypertrophy at L5-S1 without
stenosis.

## 2022-03-12 ENCOUNTER — Ambulatory Visit: Payer: Commercial Managed Care - HMO | Attending: Internal Medicine | Admitting: Internal Medicine

## 2022-03-12 ENCOUNTER — Ambulatory Visit: Payer: Commercial Managed Care - HMO | Attending: Internal Medicine

## 2022-03-12 VITALS — BP 124/68 | HR 42 | Ht 69.0 in | Wt 147.0 lb

## 2022-03-12 DIAGNOSIS — R001 Bradycardia, unspecified: Secondary | ICD-10-CM

## 2022-03-12 NOTE — Progress Notes (Signed)
Cardiology Office Note:    Date:  03/12/2022   ID:  Haley Wright, DOB June 23, 1965, MRN [id number]  PCP:  Haley Wright, [place] Providers Cardiologist:  None     Referring MD: Haley Caraway, MD   No chief complaint on file. Bradycardia   History of Present Illness:    Haley Wright is a 56 y.o. female with referral from [place] at [facility] for noted low HR 30s-40s, TSH is normal.She notes her heart rates were in the 40s-50s in the past. No prior syncope. She saw cardiologist in [place]. This was 2010. She had a stress test, and it was normal. Provides strips with Hrs in the 40s. She is a runner. She is very active. Father had suddenly hypotensive in the 20s, it was diagnosed with SSS. He got a pacemaker. Noted skin ?cancer, no prior cancer therapy. No prior cardiac dx. No lyme disease.  Past Medical History:  Diagnosis Date   Skin cancer of forehead    treatment with 5FU cream    Past Surgical History:  Procedure Laterality Date   CESAREAN SECTION      Current Medications: No outpatient medications have been marked as taking for the 03/12/22 encounter (Appointment) with Haley Mayo, MD.     Allergies:   Patient has no known allergies.   Social History   Socioeconomic History   Marital status: Significant Other    Spouse name: Haley Wright   Number of children: 3   Years of education: Not on file   Highest education level: Not on file  Occupational History   Occupation: EMT    Comment: [employer]  Tobacco Use   Smoking status: Never   Smokeless tobacco: Never  Substance and Sexual Activity   Alcohol use: No    Alcohol/week: 0.0 standard drinks of alcohol   Drug use: No   Sexual activity: Not on file  Other Topics Concern   Not on file  Social History Narrative   Lives with her husband and 2 sons. Another son is at college. Their family is host to an Forensic scientist.   Social Determinants of Health   Financial  Resource Strain: Not on file  Food Insecurity: Not on file  Transportation Needs: Not on file  Physical Activity: Not on file  Stress: Not on file  Social Connections: Not on file     Family History: The patient's family history includes Cancer in her mother. Per above   ROS:   Please see the history of present illness.     All other systems reviewed and are negative.  EKGs/Labs/Other Studies Reviewed:    The following studies were reviewed today:   EKG:  EKG is  ordered today.  The ekg ordered today demonstrates   03/12/2022- sinus bradycardia, poor r wave progression  Recent Labs: No results found for requested labs within last 365 days.   Recent Lipid Panel No results found for: "CHOL", "TRIG", "HDL", "CHOLHDL", "VLDL", "LDLCALC", "LDLDIRECT"   Risk Assessment/Calculations:     Physical Exam:    VS:   Vitals:   03/12/22 0900  BP: 124/68  Pulse: (!) 42  SpO2: 99%     Wt Readings from Last 3 Encounters:  06/15/15 151 lb 12.8 oz (68.9 kg)  06/12/15 156 lb 9.6 oz (71 kg)  01/11/14 154 lb 9.6 oz (70.1 kg)     GEN:  Well nourished, well developed in no acute distress HEENT: Normal NECK: No JVD; No carotid  bruits LYMPHATICS: No lymphadenopathy CARDIAC: RRR, no murmurs, rubs, gallops RESPIRATORY:  Clear to auscultation without rales, wheezing or rhonchi  ABDOMEN: Soft, non-tender, non-distended MUSCULOSKELETAL:  No edema; No deformity  SKIN: Warm and dry NEUROLOGIC:  Alert and oriented x 3 PSYCHIATRIC:  Normal affect   ASSESSMENT:    Sinus bradycardia: unclear etiology. She has no hx of ischemic dx. She is very healthy. No prior lyme dx. She may have a lower athletic heart rate.  Will get an ETT to assess for chronotropic incompetence, TTE to assess for structural heart dx, and a ziopatch to assess for any concerning bradyarrhythmia/AV block.   PLAN:    In order of problems listed above:  ETT 7 day ziopatch TTE Follow up 3 months      Shared  Decision Making/Informed Consent The risks [chest pain, shortness of breath, cardiac arrhythmias, dizziness, blood pressure fluctuations, myocardial infarction, stroke/transient ischemic attack, and life-threatening complications (estimated to be 1 in 10,000)], benefits (risk stratification, diagnosing coronary artery disease, treatment guidance) and alternatives of an exercise tolerance test were discussed in detail with [name] and she agrees to proceed.    Medication Adjustments/Labs and Tests Ordered: Current medicines are reviewed at length with the patient today.  Concerns regarding medicines are outlined above.  No orders of the defined types were placed in this encounter.  No orders of the defined types were placed in this encounter.   There are no Patient Instructions on file for this visit.   Signed, Haley Mayo, MD  03/12/2022 7:41 AM    [place]

## 2022-03-12 NOTE — Progress Notes (Unsigned)
Enrolled for [employer] to mail a ZIO XT long term holter monitor to the patients address on file.  

## 2022-03-12 NOTE — Patient Instructions (Signed)
Medication Instructions:  No Changes In Medications at this time.  *If you need a refill on your cardiac medications before your next appointment, please call your pharmacy*  Lab Work: None Ordered At This Time.  If you have labs (blood work) drawn today and your tests are completely normal, you will receive your results only by: [place] (if you have MyChart) OR A paper copy in the mail If you have any lab test that is abnormal or we need to change your treatment, we will call you to review the results.  Testing/Procedures:   Your physician has requested that you have an exercise tolerance test. For further information please visit [email or web address]. Please also follow instruction sheet, as given.  Your physician has requested that you have an echocardiogram. Echocardiography is a painless test that uses sound waves to create images of your heart. It provides your doctor with information about the size and shape of your heart and how well your heart's chambers and valves are working. You may receive an ultrasound enhancing agent through an IV if needed to better visualize your heart during the echo.This procedure takes approximately one hour. There are no restrictions for this procedure. This will take place at the [address].    ZIO XT- Long Term Monitor Instructions   Your physician has requested you wear your ZIO patch monitor___7____days.   This is a single patch monitor.  [employer] supplies one patch monitor per enrollment.  Additional stickers are not available.   Please do not apply patch if you will be having a Nuclear Stress Test, Echocardiogram, Cardiac CT, MRI, or Chest Xray during the time frame you would be wearing the monitor. The patch cannot be worn during these tests.  You cannot remove and re-apply the ZIO XT patch monitor.   Your ZIO patch monitor will be sent USPS Priority mail from [facility] directly to your home address. The monitor  may also be mailed to a PO BOX if home delivery is not available.   It may take 3-5 days to receive your monitor after you have been enrolled.   Once you have received you monitor, please review enclosed instructions.  Your monitor has already been registered assigning a specific monitor serial # to you.   Applying the monitor   Shave hair from upper left chest.   Hold abrader disc by orange tab.  Rub abrader in 40 strokes over left upper chest as indicated in your monitor instructions.   Clean area with 4 enclosed alcohol pads .  Use all pads to assure are is cleaned thoroughly.  Let dry.   Apply patch as indicated in monitor instructions.  Patch will be place under collarbone on left side of chest with arrow pointing upward.   Rub patch adhesive wings for 2 minutes.Remove white label marked "1".  Remove white label marked "2".  Rub patch adhesive wings for 2 additional minutes.   While looking in a mirror, press and release button in center of patch.  A small green light will flash 3-4 times .  This will be your only indicator the monitor has been turned on.     Do not shower for the first 24 hours.  You may shower after the first 24 hours.   Press button if you feel a symptom. You will hear a small click.  Record Date, Time and Symptom in the Patient Log Book.   When you are ready to remove patch, follow instructions  on last 2 pages of Patient Log Book.  Stick patch monitor onto last page of Patient Log Book.   Place Patient Log Book in [place] box.  Use locking tab on box and tape box closed securely.  The [employer] has [employer] on it.  Please place in mailbox as soon as possible.  Your physician should have your test results approximately 7 days after the monitor has been mailed back to [facility].   Call [place] at [phone] if you have questions regarding your ZIO XT patch monitor.  Call them immediately if you see an orange light blinking  on your monitor.   If your monitor falls off in less than 4 days contact our Monitor department at [phone].  If your monitor becomes loose or falls off after 4 days call [employer] at [phone] for suggestions on securing your monitor.   Follow-Up: At [facility], you and your health needs are our priority.  As part of our continuing mission to provide you with exceptional heart care, we have created designated Provider Care Teams.  These Care Teams include your primary Cardiologist (physician) and Advanced Practice Providers (APPs -  Physician Assistants and Nurse Practitioners) who all work together to provide you with the care you need, when you need it.  Your next appointment:   3 month(s)  The format for your next appointment:   In Person  Provider:   Janina Mayo, MD

## 2022-03-14 DIAGNOSIS — R001 Bradycardia, unspecified: Secondary | ICD-10-CM

## 2022-03-14 DIAGNOSIS — I498 Other specified cardiac arrhythmias: Secondary | ICD-10-CM

## 2022-03-14 DIAGNOSIS — I4719 Other supraventricular tachycardia: Secondary | ICD-10-CM

## 2022-03-19 ENCOUNTER — Ambulatory Visit: Payer: Commercial Managed Care - HMO | Attending: Internal Medicine

## 2022-03-19 DIAGNOSIS — R001 Bradycardia, unspecified: Secondary | ICD-10-CM

## 2022-03-19 LAB — EXERCISE TOLERANCE TEST
Angina Index: 0
Duke Treadmill Score: 11
Estimated workload: 18
Exercise duration (min): 15 min
Exercise duration (sec): 32 s
MPHR: 164 {beats}/min
Peak HR: 142 {beats}/min
Percent HR: 86 %
Rest HR: 41 {beats}/min
ST Depression (mm): 1 mm

## 2022-04-02 ENCOUNTER — Ambulatory Visit (HOSPITAL_COMMUNITY): Payer: Commercial Managed Care - HMO | Attending: Cardiology

## 2022-04-02 DIAGNOSIS — R001 Bradycardia, unspecified: Secondary | ICD-10-CM | POA: Diagnosis present

## 2022-04-02 LAB — ECHOCARDIOGRAM COMPLETE
Area-P 1/2: 4.15 cm2
S' Lateral: 2.9 cm

## 2022-04-20 ENCOUNTER — Encounter: Payer: Self-pay | Admitting: Internal Medicine

## 2022-06-18 ENCOUNTER — Ambulatory Visit: Payer: Self-pay | Admitting: Internal Medicine

## 2022-06-23 ENCOUNTER — Ambulatory Visit: Payer: 59 | Attending: Internal Medicine | Admitting: Internal Medicine

## 2022-06-23 ENCOUNTER — Encounter: Payer: Self-pay | Admitting: Internal Medicine

## 2022-06-23 VITALS — BP 110/60 | HR 45 | Ht 70.0 in | Wt 148.8 lb

## 2022-06-23 DIAGNOSIS — R001 Bradycardia, unspecified: Secondary | ICD-10-CM

## 2022-06-23 NOTE — Progress Notes (Signed)
Cardiology Office Note:    Date:  06/23/2022   ID:  Haley Wright, DOB 04/28/1966, MRN [id number]  PCP:  Haley Wright, [place] Providers Cardiologist:  Haley Mayo, MD     Referring MD: Haley Caraway, MD   No chief complaint on file. Bradycardia   History of Present Illness:    Haley Wright is a 57 y.o. female with referral from [place] at [facility] for noted low HR 30s-40s, TSH is normal.She notes her heart rates were in the 40s-50s in the past. No prior syncope. She saw cardiologist in [place]. This was 2010. She had a stress test, and it was normal. Provides strips with Hrs in the 40s. She is a runner. She is very active. Father had suddenly hypotensive in the 20s, it was diagnosed with SSS. He got a pacemaker. Noted skin ?cancer, no prior cancer therapy. No prior cardiac dx. No lyme disease.  Interim Hx 06/23/2022 She did an ETT with 18 METS, she augmented her heart rate. Her echo and zio was normal.  Asymptomatic. No hospitializations  Past Medical History:  Diagnosis Date   Skin cancer of forehead    treatment with 5FU cream    Past Surgical History:  Procedure Laterality Date   CESAREAN SECTION      Current Medications: No outpatient medications have been marked as taking for the 06/23/22 encounter (Appointment) with Haley Mayo, MD.     Allergies:   Sulfamethoxazole   Social History   Socioeconomic History   Marital status: Significant Other    Spouse name: Haley Wright   Number of children: 3   Years of education: Not on file   Highest education level: Not on file  Occupational History   Occupation: EMT    Comment: [employer]  Tobacco Use   Smoking status: Never   Smokeless tobacco: Never  Substance and Sexual Activity   Alcohol use: No    Alcohol/week: 0.0 standard drinks of alcohol   Drug use: No   Sexual activity: Not on file  Other Topics Concern   Not on file  Social History Narrative   Lives with  her husband and 2 sons. Another son is at college. Their family is host to an Forensic scientist.   Social Determinants of Health   Financial Resource Strain: Not on file  Food Insecurity: Not on file  Transportation Needs: Not on file  Physical Activity: Not on file  Stress: Not on file  Social Connections: Not on file     Family History: The patient's family history includes Cancer in her mother. Per above   ROS:   Please see the history of present illness.     All other systems reviewed and are negative.  EKGs/Labs/Other Studies Reviewed:    The following studies were reviewed today:   EKG:  EKG is  ordered today.  The ekg ordered today demonstrates   03/12/2022- sinus bradycardia, poor r wave progression  Recent Labs: No results found for requested labs within last 365 days.   Recent Lipid Panel No results found for: "CHOL", "TRIG", "HDL", "CHOLHDL", "VLDL", "LDLCALC", "LDLDIRECT"   Risk Assessment/Calculations:     Physical Exam:    VS:   Vitals:   06/23/22 0809  BP: 110/60  Pulse: (!) 45  SpO2: (!) 10%    Wt Readings from Last 3 Encounters:  03/12/22 147 lb (66.7 kg)  06/15/15 151 lb 12.8 oz (68.9 kg)  06/12/15 156 lb 9.6 oz (  71 kg)     GEN:  Well nourished, well developed in no acute distress HEENT: Normal NECK: No JVD; No carotid bruits LYMPHATICS: No lymphadenopathy CARDIAC: RRR, no murmurs, rubs, gallops RESPIRATORY:  Clear to auscultation without rales, wheezing or rhonchi  ABDOMEN: Soft, non-tender, non-distended MUSCULOSKELETAL:  No edema; No deformity  SKIN: Warm and dry NEUROLOGIC:  Alert and oriented x 3 PSYCHIATRIC:  Normal affect   ASSESSMENT:    Sinus bradycardia: benign. She has no hx of ischemic dx. She is very healthy. No prior lyme dx. She may have a lower athletic heart rate. Her ETT demonstrates that she does not have chronotropic incompetence. Her zio showed brief SVT. She had minimal sinus bradycardia. Her echo was normal.   Her sinus bradycardia is in the setting of increased vagal tone. This is an athletic heart rate. We discussed this. No further w/u is needed  PLAN:    In order of problems listed above:  No further cardiac w/u or surveillance required at this time     Medication Adjustments/Labs and Tests Ordered: Current medicines are reviewed at length with the patient today.  Concerns regarding medicines are outlined above.  No orders of the defined types were placed in this encounter.  No orders of the defined types were placed in this encounter.   There are no Patient Instructions on file for this visit.   Signed, Haley Mayo, MD  06/23/2022 8:03 AM    [place]

## 2022-06-23 NOTE — Patient Instructions (Signed)
Medication Instructions:  Your physician recommends that you continue on your current medications as directed. Please refer to the Current Medication list given to you today.  *If you need a refill on your cardiac medications before your next appointment, please call your pharmacy*   Lab Work: NONE If you have labs (blood work) drawn today and your tests are completely normal, you will receive your results only by: [place] (if you have MyChart) OR A paper copy in the mail If you have any lab test that is abnormal or we need to change your treatment, we will call you to review the results.   Testing/Procedures: NONE   Follow-Up: At [facility], you and your health needs are our priority.  As part of our continuing mission to provide you with exceptional heart care, we have created designated Provider Care Teams.  These Care Teams include your primary Cardiologist (physician) and Advanced Practice Providers (APPs -  Physician Assistants and Nurse Practitioners) who all work together to provide you with the care you need, when you need it.  We recommend signing up for the patient portal called "MyChart".  Sign up information is provided on this After Visit Summary.  MyChart is used to connect with patients for Virtual Visits (Telemedicine).  Patients are able to view lab/test results, encounter notes, upcoming appointments, etc.  Non-urgent messages can be sent to your provider as well.   To learn more about what you can do with MyChart, go to [email or web address].    Your next appointment:   As Needed   Provider:   Janina Mayo, MD

## 2022-06-25 DIAGNOSIS — Z23 Encounter for immunization: Secondary | ICD-10-CM | POA: Diagnosis not present

## 2022-07-09 DIAGNOSIS — S82831A Other fracture of upper and lower end of right fibula, initial encounter for closed fracture: Secondary | ICD-10-CM | POA: Diagnosis not present

## 2022-07-09 DIAGNOSIS — S99911A Unspecified injury of right ankle, initial encounter: Secondary | ICD-10-CM | POA: Diagnosis not present

## 2022-07-10 DIAGNOSIS — M25571 Pain in right ankle and joints of right foot: Secondary | ICD-10-CM | POA: Diagnosis not present

## 2022-07-30 DIAGNOSIS — S82831A Other fracture of upper and lower end of right fibula, initial encounter for closed fracture: Secondary | ICD-10-CM | POA: Diagnosis not present

## 2022-08-20 DIAGNOSIS — M9902 Segmental and somatic dysfunction of thoracic region: Secondary | ICD-10-CM | POA: Diagnosis not present

## 2022-08-20 DIAGNOSIS — M5137 Other intervertebral disc degeneration, lumbosacral region: Secondary | ICD-10-CM | POA: Diagnosis not present

## 2022-08-20 DIAGNOSIS — M9903 Segmental and somatic dysfunction of lumbar region: Secondary | ICD-10-CM | POA: Diagnosis not present

## 2022-08-20 DIAGNOSIS — M9904 Segmental and somatic dysfunction of sacral region: Secondary | ICD-10-CM | POA: Diagnosis not present

## 2022-08-20 DIAGNOSIS — M47816 Spondylosis without myelopathy or radiculopathy, lumbar region: Secondary | ICD-10-CM | POA: Diagnosis not present

## 2022-08-20 DIAGNOSIS — M6240 Contracture of muscle, unspecified site: Secondary | ICD-10-CM | POA: Diagnosis not present

## 2022-08-20 DIAGNOSIS — M47817 Spondylosis without myelopathy or radiculopathy, lumbosacral region: Secondary | ICD-10-CM | POA: Diagnosis not present

## 2022-08-21 DIAGNOSIS — S82831A Other fracture of upper and lower end of right fibula, initial encounter for closed fracture: Secondary | ICD-10-CM | POA: Diagnosis not present

## 2022-08-24 DIAGNOSIS — M9904 Segmental and somatic dysfunction of sacral region: Secondary | ICD-10-CM | POA: Diagnosis not present

## 2022-08-24 DIAGNOSIS — M9902 Segmental and somatic dysfunction of thoracic region: Secondary | ICD-10-CM | POA: Diagnosis not present

## 2022-08-24 DIAGNOSIS — M47817 Spondylosis without myelopathy or radiculopathy, lumbosacral region: Secondary | ICD-10-CM | POA: Diagnosis not present

## 2022-08-24 DIAGNOSIS — M5137 Other intervertebral disc degeneration, lumbosacral region: Secondary | ICD-10-CM | POA: Diagnosis not present

## 2022-08-24 DIAGNOSIS — M6240 Contracture of muscle, unspecified site: Secondary | ICD-10-CM | POA: Diagnosis not present

## 2022-08-24 DIAGNOSIS — M9903 Segmental and somatic dysfunction of lumbar region: Secondary | ICD-10-CM | POA: Diagnosis not present

## 2022-08-24 DIAGNOSIS — M47816 Spondylosis without myelopathy or radiculopathy, lumbar region: Secondary | ICD-10-CM | POA: Diagnosis not present

## 2022-08-31 DIAGNOSIS — M47817 Spondylosis without myelopathy or radiculopathy, lumbosacral region: Secondary | ICD-10-CM | POA: Diagnosis not present

## 2022-08-31 DIAGNOSIS — M9904 Segmental and somatic dysfunction of sacral region: Secondary | ICD-10-CM | POA: Diagnosis not present

## 2022-08-31 DIAGNOSIS — M9903 Segmental and somatic dysfunction of lumbar region: Secondary | ICD-10-CM | POA: Diagnosis not present

## 2022-08-31 DIAGNOSIS — M9902 Segmental and somatic dysfunction of thoracic region: Secondary | ICD-10-CM | POA: Diagnosis not present

## 2022-08-31 DIAGNOSIS — M6240 Contracture of muscle, unspecified site: Secondary | ICD-10-CM | POA: Diagnosis not present

## 2022-08-31 DIAGNOSIS — M5137 Other intervertebral disc degeneration, lumbosacral region: Secondary | ICD-10-CM | POA: Diagnosis not present

## 2022-08-31 DIAGNOSIS — M47816 Spondylosis without myelopathy or radiculopathy, lumbar region: Secondary | ICD-10-CM | POA: Diagnosis not present

## 2022-09-03 DIAGNOSIS — M9902 Segmental and somatic dysfunction of thoracic region: Secondary | ICD-10-CM | POA: Diagnosis not present

## 2022-09-03 DIAGNOSIS — M47817 Spondylosis without myelopathy or radiculopathy, lumbosacral region: Secondary | ICD-10-CM | POA: Diagnosis not present

## 2022-09-03 DIAGNOSIS — M5137 Other intervertebral disc degeneration, lumbosacral region: Secondary | ICD-10-CM | POA: Diagnosis not present

## 2022-09-03 DIAGNOSIS — M47816 Spondylosis without myelopathy or radiculopathy, lumbar region: Secondary | ICD-10-CM | POA: Diagnosis not present

## 2022-09-03 DIAGNOSIS — M9904 Segmental and somatic dysfunction of sacral region: Secondary | ICD-10-CM | POA: Diagnosis not present

## 2022-09-03 DIAGNOSIS — M9903 Segmental and somatic dysfunction of lumbar region: Secondary | ICD-10-CM | POA: Diagnosis not present

## 2022-09-03 DIAGNOSIS — M6240 Contracture of muscle, unspecified site: Secondary | ICD-10-CM | POA: Diagnosis not present

## 2022-09-09 DIAGNOSIS — M9904 Segmental and somatic dysfunction of sacral region: Secondary | ICD-10-CM | POA: Diagnosis not present

## 2022-09-09 DIAGNOSIS — M9903 Segmental and somatic dysfunction of lumbar region: Secondary | ICD-10-CM | POA: Diagnosis not present

## 2022-09-09 DIAGNOSIS — M47816 Spondylosis without myelopathy or radiculopathy, lumbar region: Secondary | ICD-10-CM | POA: Diagnosis not present

## 2022-09-09 DIAGNOSIS — M5137 Other intervertebral disc degeneration, lumbosacral region: Secondary | ICD-10-CM | POA: Diagnosis not present

## 2022-09-09 DIAGNOSIS — M6240 Contracture of muscle, unspecified site: Secondary | ICD-10-CM | POA: Diagnosis not present

## 2022-09-09 DIAGNOSIS — M47817 Spondylosis without myelopathy or radiculopathy, lumbosacral region: Secondary | ICD-10-CM | POA: Diagnosis not present

## 2022-09-09 DIAGNOSIS — M9902 Segmental and somatic dysfunction of thoracic region: Secondary | ICD-10-CM | POA: Diagnosis not present

## 2022-09-14 DIAGNOSIS — M9904 Segmental and somatic dysfunction of sacral region: Secondary | ICD-10-CM | POA: Diagnosis not present

## 2022-09-14 DIAGNOSIS — M9902 Segmental and somatic dysfunction of thoracic region: Secondary | ICD-10-CM | POA: Diagnosis not present

## 2022-09-14 DIAGNOSIS — M5137 Other intervertebral disc degeneration, lumbosacral region: Secondary | ICD-10-CM | POA: Diagnosis not present

## 2022-09-14 DIAGNOSIS — M47817 Spondylosis without myelopathy or radiculopathy, lumbosacral region: Secondary | ICD-10-CM | POA: Diagnosis not present

## 2022-09-14 DIAGNOSIS — M9903 Segmental and somatic dysfunction of lumbar region: Secondary | ICD-10-CM | POA: Diagnosis not present

## 2022-09-14 DIAGNOSIS — M6240 Contracture of muscle, unspecified site: Secondary | ICD-10-CM | POA: Diagnosis not present

## 2022-09-14 DIAGNOSIS — M47816 Spondylosis without myelopathy or radiculopathy, lumbar region: Secondary | ICD-10-CM | POA: Diagnosis not present

## 2022-09-16 DIAGNOSIS — M47817 Spondylosis without myelopathy or radiculopathy, lumbosacral region: Secondary | ICD-10-CM | POA: Diagnosis not present

## 2022-09-16 DIAGNOSIS — M6240 Contracture of muscle, unspecified site: Secondary | ICD-10-CM | POA: Diagnosis not present

## 2022-09-16 DIAGNOSIS — M5137 Other intervertebral disc degeneration, lumbosacral region: Secondary | ICD-10-CM | POA: Diagnosis not present

## 2022-09-16 DIAGNOSIS — M47816 Spondylosis without myelopathy or radiculopathy, lumbar region: Secondary | ICD-10-CM | POA: Diagnosis not present

## 2022-09-16 DIAGNOSIS — M9902 Segmental and somatic dysfunction of thoracic region: Secondary | ICD-10-CM | POA: Diagnosis not present

## 2022-09-16 DIAGNOSIS — M9904 Segmental and somatic dysfunction of sacral region: Secondary | ICD-10-CM | POA: Diagnosis not present

## 2022-09-16 DIAGNOSIS — M9903 Segmental and somatic dysfunction of lumbar region: Secondary | ICD-10-CM | POA: Diagnosis not present

## 2022-10-01 DIAGNOSIS — M9902 Segmental and somatic dysfunction of thoracic region: Secondary | ICD-10-CM | POA: Diagnosis not present

## 2022-10-01 DIAGNOSIS — M5137 Other intervertebral disc degeneration, lumbosacral region: Secondary | ICD-10-CM | POA: Diagnosis not present

## 2022-10-01 DIAGNOSIS — M9904 Segmental and somatic dysfunction of sacral region: Secondary | ICD-10-CM | POA: Diagnosis not present

## 2022-10-01 DIAGNOSIS — M47817 Spondylosis without myelopathy or radiculopathy, lumbosacral region: Secondary | ICD-10-CM | POA: Diagnosis not present

## 2022-10-01 DIAGNOSIS — M6240 Contracture of muscle, unspecified site: Secondary | ICD-10-CM | POA: Diagnosis not present

## 2022-10-01 DIAGNOSIS — M47816 Spondylosis without myelopathy or radiculopathy, lumbar region: Secondary | ICD-10-CM | POA: Diagnosis not present

## 2022-10-01 DIAGNOSIS — M9903 Segmental and somatic dysfunction of lumbar region: Secondary | ICD-10-CM | POA: Diagnosis not present

## 2022-10-08 DIAGNOSIS — S82831D Other fracture of upper and lower end of right fibula, subsequent encounter for closed fracture with routine healing: Secondary | ICD-10-CM | POA: Diagnosis not present

## 2022-10-08 DIAGNOSIS — M6240 Contracture of muscle, unspecified site: Secondary | ICD-10-CM | POA: Diagnosis not present

## 2022-10-08 DIAGNOSIS — M5137 Other intervertebral disc degeneration, lumbosacral region: Secondary | ICD-10-CM | POA: Diagnosis not present

## 2022-10-08 DIAGNOSIS — M47816 Spondylosis without myelopathy or radiculopathy, lumbar region: Secondary | ICD-10-CM | POA: Diagnosis not present

## 2022-10-08 DIAGNOSIS — M9904 Segmental and somatic dysfunction of sacral region: Secondary | ICD-10-CM | POA: Diagnosis not present

## 2022-10-08 DIAGNOSIS — M9903 Segmental and somatic dysfunction of lumbar region: Secondary | ICD-10-CM | POA: Diagnosis not present

## 2022-10-08 DIAGNOSIS — M9902 Segmental and somatic dysfunction of thoracic region: Secondary | ICD-10-CM | POA: Diagnosis not present

## 2022-10-08 DIAGNOSIS — M47817 Spondylosis without myelopathy or radiculopathy, lumbosacral region: Secondary | ICD-10-CM | POA: Diagnosis not present

## 2022-10-16 DIAGNOSIS — M47816 Spondylosis without myelopathy or radiculopathy, lumbar region: Secondary | ICD-10-CM | POA: Diagnosis not present

## 2022-10-16 DIAGNOSIS — M5137 Other intervertebral disc degeneration, lumbosacral region: Secondary | ICD-10-CM | POA: Diagnosis not present

## 2022-10-16 DIAGNOSIS — M9904 Segmental and somatic dysfunction of sacral region: Secondary | ICD-10-CM | POA: Diagnosis not present

## 2022-10-16 DIAGNOSIS — M6240 Contracture of muscle, unspecified site: Secondary | ICD-10-CM | POA: Diagnosis not present

## 2022-10-16 DIAGNOSIS — M9903 Segmental and somatic dysfunction of lumbar region: Secondary | ICD-10-CM | POA: Diagnosis not present

## 2022-10-16 DIAGNOSIS — M9902 Segmental and somatic dysfunction of thoracic region: Secondary | ICD-10-CM | POA: Diagnosis not present

## 2022-10-16 DIAGNOSIS — M47817 Spondylosis without myelopathy or radiculopathy, lumbosacral region: Secondary | ICD-10-CM | POA: Diagnosis not present

## 2022-10-22 DIAGNOSIS — M5137 Other intervertebral disc degeneration, lumbosacral region: Secondary | ICD-10-CM | POA: Diagnosis not present

## 2022-10-22 DIAGNOSIS — M9903 Segmental and somatic dysfunction of lumbar region: Secondary | ICD-10-CM | POA: Diagnosis not present

## 2022-10-22 DIAGNOSIS — M47817 Spondylosis without myelopathy or radiculopathy, lumbosacral region: Secondary | ICD-10-CM | POA: Diagnosis not present

## 2022-10-22 DIAGNOSIS — M9904 Segmental and somatic dysfunction of sacral region: Secondary | ICD-10-CM | POA: Diagnosis not present

## 2022-10-22 DIAGNOSIS — M47816 Spondylosis without myelopathy or radiculopathy, lumbar region: Secondary | ICD-10-CM | POA: Diagnosis not present

## 2022-10-22 DIAGNOSIS — M9902 Segmental and somatic dysfunction of thoracic region: Secondary | ICD-10-CM | POA: Diagnosis not present

## 2022-11-05 DIAGNOSIS — M47817 Spondylosis without myelopathy or radiculopathy, lumbosacral region: Secondary | ICD-10-CM | POA: Diagnosis not present

## 2022-11-05 DIAGNOSIS — M47816 Spondylosis without myelopathy or radiculopathy, lumbar region: Secondary | ICD-10-CM | POA: Diagnosis not present

## 2022-11-05 DIAGNOSIS — M5137 Other intervertebral disc degeneration, lumbosacral region: Secondary | ICD-10-CM | POA: Diagnosis not present

## 2022-11-05 DIAGNOSIS — M9904 Segmental and somatic dysfunction of sacral region: Secondary | ICD-10-CM | POA: Diagnosis not present

## 2022-11-05 DIAGNOSIS — M9903 Segmental and somatic dysfunction of lumbar region: Secondary | ICD-10-CM | POA: Diagnosis not present

## 2022-11-05 DIAGNOSIS — M9902 Segmental and somatic dysfunction of thoracic region: Secondary | ICD-10-CM | POA: Diagnosis not present

## 2022-11-23 DIAGNOSIS — M47816 Spondylosis without myelopathy or radiculopathy, lumbar region: Secondary | ICD-10-CM | POA: Diagnosis not present

## 2022-11-23 DIAGNOSIS — M9903 Segmental and somatic dysfunction of lumbar region: Secondary | ICD-10-CM | POA: Diagnosis not present

## 2022-11-23 DIAGNOSIS — M5137 Other intervertebral disc degeneration, lumbosacral region: Secondary | ICD-10-CM | POA: Diagnosis not present

## 2022-11-23 DIAGNOSIS — M47817 Spondylosis without myelopathy or radiculopathy, lumbosacral region: Secondary | ICD-10-CM | POA: Diagnosis not present

## 2022-11-23 DIAGNOSIS — M9904 Segmental and somatic dysfunction of sacral region: Secondary | ICD-10-CM | POA: Diagnosis not present

## 2022-11-23 DIAGNOSIS — M9902 Segmental and somatic dysfunction of thoracic region: Secondary | ICD-10-CM | POA: Diagnosis not present

## 2022-12-24 DIAGNOSIS — M47816 Spondylosis without myelopathy or radiculopathy, lumbar region: Secondary | ICD-10-CM | POA: Diagnosis not present

## 2022-12-24 DIAGNOSIS — M5137 Other intervertebral disc degeneration, lumbosacral region: Secondary | ICD-10-CM | POA: Diagnosis not present

## 2022-12-24 DIAGNOSIS — M9903 Segmental and somatic dysfunction of lumbar region: Secondary | ICD-10-CM | POA: Diagnosis not present

## 2022-12-24 DIAGNOSIS — M47817 Spondylosis without myelopathy or radiculopathy, lumbosacral region: Secondary | ICD-10-CM | POA: Diagnosis not present

## 2022-12-24 DIAGNOSIS — M9904 Segmental and somatic dysfunction of sacral region: Secondary | ICD-10-CM | POA: Diagnosis not present

## 2022-12-24 DIAGNOSIS — M9902 Segmental and somatic dysfunction of thoracic region: Secondary | ICD-10-CM | POA: Diagnosis not present

## 2022-12-31 DIAGNOSIS — Z961 Presence of intraocular lens: Secondary | ICD-10-CM | POA: Diagnosis not present

## 2022-12-31 DIAGNOSIS — H0288B Meibomian gland dysfunction left eye, upper and lower eyelids: Secondary | ICD-10-CM | POA: Diagnosis not present

## 2022-12-31 DIAGNOSIS — H0288A Meibomian gland dysfunction right eye, upper and lower eyelids: Secondary | ICD-10-CM | POA: Diagnosis not present

## 2023-01-19 DIAGNOSIS — S39011A Strain of muscle, fascia and tendon of abdomen, initial encounter: Secondary | ICD-10-CM | POA: Diagnosis not present

## 2023-01-19 DIAGNOSIS — Z6821 Body mass index (BMI) 21.0-21.9, adult: Secondary | ICD-10-CM | POA: Diagnosis not present

## 2023-02-18 DIAGNOSIS — M47817 Spondylosis without myelopathy or radiculopathy, lumbosacral region: Secondary | ICD-10-CM | POA: Diagnosis not present

## 2023-02-18 DIAGNOSIS — M47816 Spondylosis without myelopathy or radiculopathy, lumbar region: Secondary | ICD-10-CM | POA: Diagnosis not present

## 2023-02-18 DIAGNOSIS — M9903 Segmental and somatic dysfunction of lumbar region: Secondary | ICD-10-CM | POA: Diagnosis not present

## 2023-02-18 DIAGNOSIS — M9902 Segmental and somatic dysfunction of thoracic region: Secondary | ICD-10-CM | POA: Diagnosis not present

## 2023-02-18 DIAGNOSIS — M9904 Segmental and somatic dysfunction of sacral region: Secondary | ICD-10-CM | POA: Diagnosis not present

## 2023-02-18 DIAGNOSIS — M5137 Other intervertebral disc degeneration, lumbosacral region: Secondary | ICD-10-CM | POA: Diagnosis not present

## 2023-03-18 DIAGNOSIS — M9904 Segmental and somatic dysfunction of sacral region: Secondary | ICD-10-CM | POA: Diagnosis not present

## 2023-03-18 DIAGNOSIS — M47816 Spondylosis without myelopathy or radiculopathy, lumbar region: Secondary | ICD-10-CM | POA: Diagnosis not present

## 2023-03-18 DIAGNOSIS — M9903 Segmental and somatic dysfunction of lumbar region: Secondary | ICD-10-CM | POA: Diagnosis not present

## 2023-03-18 DIAGNOSIS — M47817 Spondylosis without myelopathy or radiculopathy, lumbosacral region: Secondary | ICD-10-CM | POA: Diagnosis not present

## 2023-03-18 DIAGNOSIS — M9902 Segmental and somatic dysfunction of thoracic region: Secondary | ICD-10-CM | POA: Diagnosis not present

## 2023-03-18 DIAGNOSIS — M5137 Other intervertebral disc degeneration, lumbosacral region with discogenic back pain only: Secondary | ICD-10-CM | POA: Diagnosis not present

## 2023-03-29 DIAGNOSIS — L820 Inflamed seborrheic keratosis: Secondary | ICD-10-CM | POA: Diagnosis not present

## 2023-03-29 DIAGNOSIS — D2272 Melanocytic nevi of left lower limb, including hip: Secondary | ICD-10-CM | POA: Diagnosis not present

## 2023-03-29 DIAGNOSIS — D225 Melanocytic nevi of trunk: Secondary | ICD-10-CM | POA: Diagnosis not present

## 2023-03-29 DIAGNOSIS — L565 Disseminated superficial actinic porokeratosis (DSAP): Secondary | ICD-10-CM | POA: Diagnosis not present

## 2023-03-29 DIAGNOSIS — L738 Other specified follicular disorders: Secondary | ICD-10-CM | POA: Diagnosis not present

## 2023-03-29 DIAGNOSIS — Z85820 Personal history of malignant melanoma of skin: Secondary | ICD-10-CM | POA: Diagnosis not present

## 2023-03-29 DIAGNOSIS — L821 Other seborrheic keratosis: Secondary | ICD-10-CM | POA: Diagnosis not present

## 2023-03-29 DIAGNOSIS — D2261 Melanocytic nevi of right upper limb, including shoulder: Secondary | ICD-10-CM | POA: Diagnosis not present

## 2023-03-29 DIAGNOSIS — L570 Actinic keratosis: Secondary | ICD-10-CM | POA: Diagnosis not present

## 2023-03-29 DIAGNOSIS — D485 Neoplasm of uncertain behavior of skin: Secondary | ICD-10-CM | POA: Diagnosis not present

## 2023-03-29 DIAGNOSIS — D2262 Melanocytic nevi of left upper limb, including shoulder: Secondary | ICD-10-CM | POA: Diagnosis not present

## 2023-03-29 DIAGNOSIS — Z85828 Personal history of other malignant neoplasm of skin: Secondary | ICD-10-CM | POA: Diagnosis not present

## 2023-04-15 DIAGNOSIS — M9903 Segmental and somatic dysfunction of lumbar region: Secondary | ICD-10-CM | POA: Diagnosis not present

## 2023-04-15 DIAGNOSIS — M9904 Segmental and somatic dysfunction of sacral region: Secondary | ICD-10-CM | POA: Diagnosis not present

## 2023-04-15 DIAGNOSIS — M9902 Segmental and somatic dysfunction of thoracic region: Secondary | ICD-10-CM | POA: Diagnosis not present

## 2023-04-15 DIAGNOSIS — M47816 Spondylosis without myelopathy or radiculopathy, lumbar region: Secondary | ICD-10-CM | POA: Diagnosis not present

## 2023-04-15 DIAGNOSIS — M47817 Spondylosis without myelopathy or radiculopathy, lumbosacral region: Secondary | ICD-10-CM | POA: Diagnosis not present

## 2023-04-22 DIAGNOSIS — Z760 Encounter for issue of repeat prescription: Secondary | ICD-10-CM | POA: Diagnosis not present

## 2023-04-22 DIAGNOSIS — N951 Menopausal and female climacteric states: Secondary | ICD-10-CM | POA: Diagnosis not present

## 2023-04-22 DIAGNOSIS — Z01419 Encounter for gynecological examination (general) (routine) without abnormal findings: Secondary | ICD-10-CM | POA: Diagnosis not present

## 2023-04-22 DIAGNOSIS — Z6821 Body mass index (BMI) 21.0-21.9, adult: Secondary | ICD-10-CM | POA: Diagnosis not present

## 2023-04-28 DIAGNOSIS — Z0000 Encounter for general adult medical examination without abnormal findings: Secondary | ICD-10-CM | POA: Diagnosis not present

## 2023-04-28 DIAGNOSIS — E7800 Pure hypercholesterolemia, unspecified: Secondary | ICD-10-CM | POA: Diagnosis not present

## 2023-08-19 DIAGNOSIS — M26601 Right temporomandibular joint disorder, unspecified: Secondary | ICD-10-CM | POA: Diagnosis not present

## 2023-08-19 DIAGNOSIS — M62838 Other muscle spasm: Secondary | ICD-10-CM | POA: Diagnosis not present

## 2023-08-19 DIAGNOSIS — M6281 Muscle weakness (generalized): Secondary | ICD-10-CM | POA: Diagnosis not present

## 2023-10-13 DIAGNOSIS — M62838 Other muscle spasm: Secondary | ICD-10-CM | POA: Diagnosis not present

## 2023-10-13 DIAGNOSIS — M6281 Muscle weakness (generalized): Secondary | ICD-10-CM | POA: Diagnosis not present

## 2023-10-13 DIAGNOSIS — M26601 Right temporomandibular joint disorder, unspecified: Secondary | ICD-10-CM | POA: Diagnosis not present

## 2023-10-21 DIAGNOSIS — M6281 Muscle weakness (generalized): Secondary | ICD-10-CM | POA: Diagnosis not present

## 2023-10-21 DIAGNOSIS — M62838 Other muscle spasm: Secondary | ICD-10-CM | POA: Diagnosis not present

## 2023-10-21 DIAGNOSIS — L910 Hypertrophic scar: Secondary | ICD-10-CM | POA: Diagnosis not present

## 2023-10-21 DIAGNOSIS — M26601 Right temporomandibular joint disorder, unspecified: Secondary | ICD-10-CM | POA: Diagnosis not present

## 2023-11-15 DIAGNOSIS — H43812 Vitreous degeneration, left eye: Secondary | ICD-10-CM | POA: Diagnosis not present

## 2023-11-15 DIAGNOSIS — H0288B Meibomian gland dysfunction left eye, upper and lower eyelids: Secondary | ICD-10-CM | POA: Diagnosis not present

## 2023-11-15 DIAGNOSIS — Z961 Presence of intraocular lens: Secondary | ICD-10-CM | POA: Diagnosis not present

## 2023-11-15 DIAGNOSIS — H0288A Meibomian gland dysfunction right eye, upper and lower eyelids: Secondary | ICD-10-CM | POA: Diagnosis not present

## 2023-12-15 DIAGNOSIS — H43812 Vitreous degeneration, left eye: Secondary | ICD-10-CM | POA: Diagnosis not present

## 2023-12-17 DIAGNOSIS — M62838 Other muscle spasm: Secondary | ICD-10-CM | POA: Diagnosis not present

## 2023-12-17 DIAGNOSIS — M26601 Right temporomandibular joint disorder, unspecified: Secondary | ICD-10-CM | POA: Diagnosis not present

## 2023-12-17 DIAGNOSIS — L910 Hypertrophic scar: Secondary | ICD-10-CM | POA: Diagnosis not present

## 2023-12-17 DIAGNOSIS — M6281 Muscle weakness (generalized): Secondary | ICD-10-CM | POA: Diagnosis not present

## 2024-01-12 DIAGNOSIS — H43812 Vitreous degeneration, left eye: Secondary | ICD-10-CM | POA: Diagnosis not present

## 2024-02-02 DIAGNOSIS — M26601 Right temporomandibular joint disorder, unspecified: Secondary | ICD-10-CM | POA: Diagnosis not present

## 2024-02-02 DIAGNOSIS — M62838 Other muscle spasm: Secondary | ICD-10-CM | POA: Diagnosis not present

## 2024-02-02 DIAGNOSIS — L910 Hypertrophic scar: Secondary | ICD-10-CM | POA: Diagnosis not present

## 2024-02-02 DIAGNOSIS — M6281 Muscle weakness (generalized): Secondary | ICD-10-CM | POA: Diagnosis not present

## 2024-04-06 DIAGNOSIS — D2272 Melanocytic nevi of left lower limb, including hip: Secondary | ICD-10-CM | POA: Diagnosis not present

## 2024-04-06 DIAGNOSIS — Z85828 Personal history of other malignant neoplasm of skin: Secondary | ICD-10-CM | POA: Diagnosis not present

## 2024-04-06 DIAGNOSIS — L821 Other seborrheic keratosis: Secondary | ICD-10-CM | POA: Diagnosis not present

## 2024-04-06 DIAGNOSIS — L814 Other melanin hyperpigmentation: Secondary | ICD-10-CM | POA: Diagnosis not present

## 2024-04-06 DIAGNOSIS — B079 Viral wart, unspecified: Secondary | ICD-10-CM | POA: Diagnosis not present

## 2024-04-06 DIAGNOSIS — Z85820 Personal history of malignant melanoma of skin: Secondary | ICD-10-CM | POA: Diagnosis not present

## 2024-04-06 DIAGNOSIS — D485 Neoplasm of uncertain behavior of skin: Secondary | ICD-10-CM | POA: Diagnosis not present

## 2024-04-06 DIAGNOSIS — D225 Melanocytic nevi of trunk: Secondary | ICD-10-CM | POA: Diagnosis not present

## 2024-04-06 DIAGNOSIS — D2262 Melanocytic nevi of left upper limb, including shoulder: Secondary | ICD-10-CM | POA: Diagnosis not present

## 2024-04-06 DIAGNOSIS — L818 Other specified disorders of pigmentation: Secondary | ICD-10-CM | POA: Diagnosis not present

## 2024-04-06 DIAGNOSIS — L565 Disseminated superficial actinic porokeratosis (DSAP): Secondary | ICD-10-CM | POA: Diagnosis not present

## 2024-04-13 DIAGNOSIS — H43812 Vitreous degeneration, left eye: Secondary | ICD-10-CM | POA: Diagnosis not present

## 2024-05-10 DIAGNOSIS — Z6822 Body mass index (BMI) 22.0-22.9, adult: Secondary | ICD-10-CM | POA: Diagnosis not present

## 2024-05-10 DIAGNOSIS — E7800 Pure hypercholesterolemia, unspecified: Secondary | ICD-10-CM | POA: Diagnosis not present

## 2024-05-10 DIAGNOSIS — Z0000 Encounter for general adult medical examination without abnormal findings: Secondary | ICD-10-CM | POA: Diagnosis not present

## 2024-05-10 DIAGNOSIS — Z79890 Hormone replacement therapy (postmenopausal): Secondary | ICD-10-CM | POA: Diagnosis not present

## 2024-05-11 DIAGNOSIS — Z1151 Encounter for screening for human papillomavirus (HPV): Secondary | ICD-10-CM | POA: Diagnosis not present

## 2024-05-11 DIAGNOSIS — Z760 Encounter for issue of repeat prescription: Secondary | ICD-10-CM | POA: Diagnosis not present

## 2024-05-11 DIAGNOSIS — Z01419 Encounter for gynecological examination (general) (routine) without abnormal findings: Secondary | ICD-10-CM | POA: Diagnosis not present

## 2024-05-11 DIAGNOSIS — N951 Menopausal and female climacteric states: Secondary | ICD-10-CM | POA: Diagnosis not present

## 2024-05-11 DIAGNOSIS — Z124 Encounter for screening for malignant neoplasm of cervix: Secondary | ICD-10-CM | POA: Diagnosis not present

## 2024-05-11 DIAGNOSIS — Z6821 Body mass index (BMI) 21.0-21.9, adult: Secondary | ICD-10-CM | POA: Diagnosis not present
